# Patient Record
Sex: Male | Born: 1950 | Race: White | Hispanic: No | Marital: Single
Health system: Southern US, Community
[De-identification: ages and names within clinical notes are randomized; demographics above are authoritative.]

## PROBLEM LIST (undated history)

## (undated) DIAGNOSIS — I5033 Acute on chronic diastolic (congestive) heart failure: Secondary | ICD-10-CM

## (undated) DIAGNOSIS — R7881 Bacteremia: Secondary | ICD-10-CM

## (undated) DIAGNOSIS — J9621 Acute and chronic respiratory failure with hypoxia: Secondary | ICD-10-CM

## (undated) DIAGNOSIS — R188 Other ascites: Secondary | ICD-10-CM

## (undated) DIAGNOSIS — I272 Pulmonary hypertension, unspecified: Secondary | ICD-10-CM

---

## 2018-09-13 ENCOUNTER — Inpatient Hospital Stay
Admission: AD | Admit: 2018-09-13 | Discharge: 2018-10-17 | Disposition: A | Discharge status: Hospice | Payer: Medicare Other | Source: Other Acute Inpatient Hospital | Attending: Internal Medicine | Admitting: Internal Medicine

## 2018-09-13 DIAGNOSIS — J90 Pleural effusion, not elsewhere classified: Secondary | ICD-10-CM

## 2018-09-13 DIAGNOSIS — J9690 Respiratory failure, unspecified, unspecified whether with hypoxia or hypercapnia: Secondary | ICD-10-CM

## 2018-09-13 DIAGNOSIS — Z4659 Encounter for fitting and adjustment of other gastrointestinal appliance and device: Secondary | ICD-10-CM

## 2018-09-13 DIAGNOSIS — R188 Other ascites: Secondary | ICD-10-CM | POA: Diagnosis present

## 2018-09-13 DIAGNOSIS — Z9889 Other specified postprocedural states: Secondary | ICD-10-CM

## 2018-09-13 DIAGNOSIS — J189 Pneumonia, unspecified organism: Secondary | ICD-10-CM

## 2018-09-13 DIAGNOSIS — K567 Ileus, unspecified: Secondary | ICD-10-CM

## 2018-09-13 DIAGNOSIS — I5033 Acute on chronic diastolic (congestive) heart failure: Secondary | ICD-10-CM | POA: Diagnosis present

## 2018-09-13 DIAGNOSIS — R7881 Bacteremia: Secondary | ICD-10-CM | POA: Diagnosis present

## 2018-09-13 DIAGNOSIS — Z9289 Personal history of other medical treatment: Secondary | ICD-10-CM

## 2018-09-13 DIAGNOSIS — I272 Pulmonary hypertension, unspecified: Secondary | ICD-10-CM | POA: Diagnosis present

## 2018-09-13 DIAGNOSIS — J9621 Acute and chronic respiratory failure with hypoxia: Secondary | ICD-10-CM | POA: Diagnosis present

## 2018-09-13 DIAGNOSIS — J811 Chronic pulmonary edema: Secondary | ICD-10-CM

## 2018-09-13 DIAGNOSIS — N179 Acute kidney failure, unspecified: Secondary | ICD-10-CM

## 2018-09-13 HISTORY — DX: Acute and chronic respiratory failure with hypoxia: J96.21

## 2018-09-13 HISTORY — DX: Bacteremia: R78.81

## 2018-09-13 HISTORY — DX: Pulmonary hypertension, unspecified: I27.20

## 2018-09-13 HISTORY — DX: Acute on chronic diastolic (congestive) heart failure: I50.33

## 2018-09-13 HISTORY — DX: Other ascites: R18.8

## 2018-09-14 ENCOUNTER — Other Ambulatory Visit (HOSPITAL_COMMUNITY): Payer: Medicare Other

## 2018-09-14 LAB — CBC WITH DIFFERENTIAL/PLATELET
ABS IMMATURE GRANULOCYTES: 0.05 10*3/uL (ref 0.00–0.07)
Basophils Absolute: 0 10*3/uL (ref 0.0–0.1)
Basophils Relative: 0 %
Eosinophils Absolute: 0.1 10*3/uL (ref 0.0–0.5)
Eosinophils Relative: 2 %
HCT: 26.9 % — ABNORMAL LOW (ref 39.0–52.0)
Hemoglobin: 8.2 g/dL — ABNORMAL LOW (ref 13.0–17.0)
Immature Granulocytes: 1 %
LYMPHS PCT: 9 %
Lymphs Abs: 0.6 10*3/uL — ABNORMAL LOW (ref 0.7–4.0)
MCH: 28 pg (ref 26.0–34.0)
MCHC: 30.5 g/dL (ref 30.0–36.0)
MCV: 91.8 fL (ref 80.0–100.0)
MONOS PCT: 9 %
Monocytes Absolute: 0.6 10*3/uL (ref 0.1–1.0)
Neutro Abs: 5.8 10*3/uL (ref 1.7–7.7)
Neutrophils Relative %: 79 %
Platelets: 185 10*3/uL (ref 150–400)
RBC: 2.93 MIL/uL — ABNORMAL LOW (ref 4.22–5.81)
RDW: 14.4 % (ref 11.5–15.5)
WBC: 7.3 10*3/uL (ref 4.0–10.5)
nRBC: 0 % (ref 0.0–0.2)

## 2018-09-14 LAB — PROTIME-INR
INR: 1.27
Prothrombin Time: 15.8 seconds — ABNORMAL HIGH (ref 11.4–15.2)

## 2018-09-14 LAB — CK TOTAL AND CKMB (NOT AT ARMC)
CK, MB: 4.2 ng/mL (ref 0.5–5.0)
Relative Index: INVALID (ref 0.0–2.5)
Total CK: 38 U/L — ABNORMAL LOW (ref 49–397)

## 2018-09-14 LAB — COMPREHENSIVE METABOLIC PANEL
ALT: 10 U/L (ref 0–44)
AST: 23 U/L (ref 15–41)
Albumin: 2.5 g/dL — ABNORMAL LOW (ref 3.5–5.0)
Alkaline Phosphatase: 133 U/L — ABNORMAL HIGH (ref 38–126)
Anion gap: 13 (ref 5–15)
BUN: 65 mg/dL — ABNORMAL HIGH (ref 8–23)
CO2: 37 mmol/L — ABNORMAL HIGH (ref 22–32)
Calcium: 8.4 mg/dL — ABNORMAL LOW (ref 8.9–10.3)
Chloride: 97 mmol/L — ABNORMAL LOW (ref 98–111)
Creatinine, Ser: 2.26 mg/dL — ABNORMAL HIGH (ref 0.61–1.24)
GFR calc Af Amer: 33 mL/min — ABNORMAL LOW (ref >60)
GFR calc non Af Amer: 29 mL/min — ABNORMAL LOW (ref >60)
Glucose, Bld: 128 mg/dL — ABNORMAL HIGH (ref 70–99)
Potassium: 3.9 mmol/L (ref 3.5–5.1)
Sodium: 147 mmol/L — ABNORMAL HIGH (ref 135–145)
Total Bilirubin: 0.9 mg/dL (ref 0.3–1.2)
Total Protein: 6.7 g/dL (ref 6.5–8.1)

## 2018-09-14 LAB — C-REACTIVE PROTEIN: CRP: 7.3 mg/dL — AB (ref <1.0)

## 2018-09-14 LAB — MAGNESIUM: Magnesium: 2 mg/dL (ref 1.7–2.4)

## 2018-09-14 LAB — PHOSPHORUS: PHOSPHORUS: 4.8 mg/dL — AB (ref 2.5–4.6)

## 2018-09-14 LAB — SEDIMENTATION RATE: Sed Rate: 58 mm/hr — ABNORMAL HIGH (ref 0–16)

## 2018-09-14 MED ORDER — CITALOPRAM HYDROBROMIDE 20 MG PO TABS
10.00 | ORAL_TABLET | ORAL | Status: DC
Start: 2018-09-14 — End: 2018-09-14

## 2018-09-14 MED ORDER — SODIUM CHLORIDE 0.9 % IV SOLN
10.00 | INTRAVENOUS | Status: DC
Start: ? — End: 2018-09-14

## 2018-09-14 MED ORDER — GENERIC EXTERNAL MEDICATION
8.00 | Status: DC
Start: 2018-09-14 — End: 2018-09-14

## 2018-09-14 MED ORDER — POLYETHYLENE GLYCOL 3350 17 G PO PACK
17.00 | PACK | ORAL | Status: DC
Start: 2018-09-14 — End: 2018-09-14

## 2018-09-14 MED ORDER — NITROGLYCERIN 0.4 MG SL SUBL
.40 | SUBLINGUAL_TABLET | SUBLINGUAL | Status: DC
Start: ? — End: 2018-09-14

## 2018-09-14 MED ORDER — OXYCODONE HCL 5 MG PO TABS
5.00 | ORAL_TABLET | ORAL | Status: DC
Start: ? — End: 2018-09-14

## 2018-09-14 MED ORDER — GENERIC EXTERNAL MEDICATION
10.00 | Status: DC
Start: ? — End: 2018-09-14

## 2018-09-14 MED ORDER — PANTOPRAZOLE SODIUM 40 MG IV SOLR
40.00 | INTRAVENOUS | Status: DC
Start: 2018-09-13 — End: 2018-09-14

## 2018-09-14 MED ORDER — FAT EMULSION PLANT BASED 20 % IV EMUL
250.00 | INTRAVENOUS | Status: DC
Start: 2018-09-14 — End: 2018-09-14

## 2018-09-14 MED ORDER — GENERIC EXTERNAL MEDICATION
4.00 | Status: DC
Start: ? — End: 2018-09-14

## 2018-09-14 MED ORDER — LACTULOSE 10 GM/15ML PO SOLN
30.00 | ORAL | Status: DC
Start: ? — End: 2018-09-14

## 2018-09-14 MED ORDER — BIOTENE ORALBALANCE DRY MOUTH MT GEL
OROMUCOSAL | Status: DC
Start: ? — End: 2018-09-14

## 2018-09-14 MED ORDER — IPRATROPIUM-ALBUTEROL 0.5-2.5 (3) MG/3ML IN SOLN
3.00 | RESPIRATORY_TRACT | Status: DC
Start: 2018-09-13 — End: 2018-09-14

## 2018-09-14 MED ORDER — FENTANYL CITRATE (PF) 2500 MCG/50ML IJ SOLN
12.50 | INTRAMUSCULAR | Status: DC
Start: ? — End: 2018-09-14

## 2018-09-14 MED ORDER — PROPRANOLOL HCL 10 MG PO TABS
10.00 | ORAL_TABLET | ORAL | Status: DC
Start: 2018-09-13 — End: 2018-09-14

## 2018-09-14 MED ORDER — HYDRALAZINE HCL 20 MG/ML IJ SOLN
20.00 | INTRAMUSCULAR | Status: DC
Start: 2018-09-13 — End: 2018-09-14

## 2018-09-14 MED ORDER — ACETAMINOPHEN 325 MG PO TABS
650.00 | ORAL_TABLET | ORAL | Status: DC
Start: ? — End: 2018-09-14

## 2018-09-14 MED ORDER — ALBUTEROL SULFATE (2.5 MG/3ML) 0.083% IN NEBU
2.50 | INHALATION_SOLUTION | RESPIRATORY_TRACT | Status: DC
Start: ? — End: 2018-09-14

## 2018-09-14 MED ORDER — LABETALOL HCL 5 MG/ML IV SOLN
20.00 | INTRAVENOUS | Status: DC
Start: 2018-09-13 — End: 2018-09-14

## 2018-09-14 MED ORDER — FUROSEMIDE 10 MG/ML IJ SOLN
60.00 | INTRAMUSCULAR | Status: DC
Start: 2018-09-13 — End: 2018-09-14

## 2018-09-15 ENCOUNTER — Other Ambulatory Visit (HOSPITAL_COMMUNITY): Payer: Medicare Other

## 2018-09-15 LAB — CBC
HCT: 24.3 % — ABNORMAL LOW (ref 39.0–52.0)
Hemoglobin: 7.8 g/dL — ABNORMAL LOW (ref 13.0–17.0)
MCH: 29 pg (ref 26.0–34.0)
MCHC: 32.1 g/dL (ref 30.0–36.0)
MCV: 90.3 fL (ref 80.0–100.0)
Platelets: 173 10*3/uL (ref 150–400)
RBC: 2.69 MIL/uL — ABNORMAL LOW (ref 4.22–5.81)
RDW: 14.4 % (ref 11.5–15.5)
WBC: 8 10*3/uL (ref 4.0–10.5)
nRBC: 0 % (ref 0.0–0.2)

## 2018-09-15 LAB — RENAL FUNCTION PANEL
Albumin: 2.4 g/dL — ABNORMAL LOW (ref 3.5–5.0)
Anion gap: 11 (ref 5–15)
BUN: 65 mg/dL — ABNORMAL HIGH (ref 8–23)
CALCIUM: 8.5 mg/dL — AB (ref 8.9–10.3)
CO2: 37 mmol/L — ABNORMAL HIGH (ref 22–32)
Chloride: 95 mmol/L — ABNORMAL LOW (ref 98–111)
Creatinine, Ser: 2.31 mg/dL — ABNORMAL HIGH (ref 0.61–1.24)
GFR calc Af Amer: 32 mL/min — ABNORMAL LOW (ref >60)
GFR calc non Af Amer: 28 mL/min — ABNORMAL LOW (ref >60)
Glucose, Bld: 142 mg/dL — ABNORMAL HIGH (ref 70–99)
Phosphorus: 4.1 mg/dL (ref 2.5–4.6)
Potassium: 3.7 mmol/L (ref 3.5–5.1)
Sodium: 143 mmol/L (ref 135–145)

## 2018-09-15 LAB — CK TOTAL AND CKMB (NOT AT ARMC)
CK, MB: 4.4 ng/mL (ref 0.5–5.0)
Relative Index: INVALID (ref 0.0–2.5)
Total CK: 38 U/L — ABNORMAL LOW (ref 49–397)

## 2018-09-15 LAB — MAGNESIUM: Magnesium: 2 mg/dL (ref 1.7–2.4)

## 2018-09-15 LAB — AMMONIA: Ammonia: 40 umol/L — ABNORMAL HIGH (ref 9–35)

## 2018-09-16 ENCOUNTER — Other Ambulatory Visit (HOSPITAL_COMMUNITY): Payer: Medicare Other

## 2018-09-16 LAB — BLOOD GAS, ARTERIAL
Acid-Base Excess: 11.6 mmol/L — ABNORMAL HIGH (ref 0.0–2.0)
Bicarbonate: 35.5 mmol/L — ABNORMAL HIGH (ref 20.0–28.0)
FIO2: 100
PEEP: 5 cmH2O
PO2 ART: 284 mmHg — AB (ref 83.0–108.0)
Patient temperature: 98.6
RATE: 16 resp/min
pCO2 arterial: 44.2 mmHg (ref 32.0–48.0)
pH, Arterial: 7.515 — ABNORMAL HIGH (ref 7.350–7.450)

## 2018-09-16 LAB — CBC
HCT: 25.6 % — ABNORMAL LOW (ref 39.0–52.0)
Hemoglobin: 8.1 g/dL — ABNORMAL LOW (ref 13.0–17.0)
MCH: 28.8 pg (ref 26.0–34.0)
MCHC: 31.6 g/dL (ref 30.0–36.0)
MCV: 91.1 fL (ref 80.0–100.0)
PLATELETS: 214 10*3/uL (ref 150–400)
RBC: 2.81 MIL/uL — ABNORMAL LOW (ref 4.22–5.81)
RDW: 14.5 % (ref 11.5–15.5)
WBC: 9.1 10*3/uL (ref 4.0–10.5)
nRBC: 0 % (ref 0.0–0.2)

## 2018-09-16 LAB — CK TOTAL AND CKMB (NOT AT ARMC)
CK, MB: 4.1 ng/mL (ref 0.5–5.0)
RELATIVE INDEX: INVALID (ref 0.0–2.5)
Total CK: 40 U/L — ABNORMAL LOW (ref 49–397)

## 2018-09-16 LAB — COMPREHENSIVE METABOLIC PANEL
ALT: 12 U/L (ref 0–44)
ANION GAP: 13 (ref 5–15)
AST: 23 U/L (ref 15–41)
Albumin: 2.3 g/dL — ABNORMAL LOW (ref 3.5–5.0)
Alkaline Phosphatase: 145 U/L — ABNORMAL HIGH (ref 38–126)
BUN: 69 mg/dL — ABNORMAL HIGH (ref 8–23)
CO2: 33 mmol/L — ABNORMAL HIGH (ref 22–32)
Calcium: 8.2 mg/dL — ABNORMAL LOW (ref 8.9–10.3)
Chloride: 95 mmol/L — ABNORMAL LOW (ref 98–111)
Creatinine, Ser: 2.89 mg/dL — ABNORMAL HIGH (ref 0.61–1.24)
GFR calc Af Amer: 25 mL/min — ABNORMAL LOW (ref >60)
GFR calc non Af Amer: 21 mL/min — ABNORMAL LOW (ref >60)
Glucose, Bld: 189 mg/dL — ABNORMAL HIGH (ref 70–99)
Potassium: 4.5 mmol/L (ref 3.5–5.1)
SODIUM: 141 mmol/L (ref 135–145)
Total Bilirubin: 1.2 mg/dL (ref 0.3–1.2)
Total Protein: 6.7 g/dL (ref 6.5–8.1)

## 2018-09-16 LAB — APTT: aPTT: 46 seconds — ABNORMAL HIGH (ref 24–36)

## 2018-09-16 LAB — TROPONIN I
Troponin I: 0.04 ng/mL (ref <0.03)
Troponin I: 0.06 ng/mL (ref <0.03)
Troponin I: 0.08 ng/mL (ref <0.03)
Troponin I: 0.08 ng/mL (ref <0.03)

## 2018-09-16 LAB — PROTIME-INR
INR: 1.33
Prothrombin Time: 16.4 seconds — ABNORMAL HIGH (ref 11.4–15.2)

## 2018-09-16 LAB — PHOSPHORUS: Phosphorus: 5.1 mg/dL — ABNORMAL HIGH (ref 2.5–4.6)

## 2018-09-16 LAB — LACTIC ACID, PLASMA: Lactic Acid, Venous: 2 mmol/L (ref 0.5–1.9)

## 2018-09-16 LAB — MAGNESIUM: Magnesium: 1.9 mg/dL (ref 1.7–2.4)

## 2018-09-16 MED ORDER — LIDOCAINE HCL (PF) 1 % IJ SOLN
INTRAMUSCULAR | Status: AC
  Filled 2018-09-16: qty 30

## 2018-09-16 NOTE — Procedures (Signed)
Ultrasound-guided therapeutic paracentesis performed yielding 4.9 liters of yellow fluid. No immediate complications. EBL none.   

## 2018-09-17 ENCOUNTER — Other Ambulatory Visit (HOSPITAL_COMMUNITY): Payer: Medicare Other

## 2018-09-17 ENCOUNTER — Encounter: Payer: Self-pay | Admitting: Internal Medicine

## 2018-09-17 DIAGNOSIS — R188 Other ascites: Secondary | ICD-10-CM

## 2018-09-17 DIAGNOSIS — J9621 Acute and chronic respiratory failure with hypoxia: Secondary | ICD-10-CM | POA: Diagnosis not present

## 2018-09-17 DIAGNOSIS — R7881 Bacteremia: Secondary | ICD-10-CM | POA: Diagnosis present

## 2018-09-17 DIAGNOSIS — I5033 Acute on chronic diastolic (congestive) heart failure: Secondary | ICD-10-CM | POA: Diagnosis not present

## 2018-09-17 DIAGNOSIS — I272 Pulmonary hypertension, unspecified: Secondary | ICD-10-CM | POA: Diagnosis present

## 2018-09-17 LAB — RENAL FUNCTION PANEL
Albumin: 2.3 g/dL — ABNORMAL LOW (ref 3.5–5.0)
Anion gap: 15 (ref 5–15)
BUN: 74 mg/dL — ABNORMAL HIGH (ref 8–23)
CO2: 30 mmol/L (ref 22–32)
Calcium: 8 mg/dL — ABNORMAL LOW (ref 8.9–10.3)
Chloride: 98 mmol/L (ref 98–111)
Creatinine, Ser: 2.52 mg/dL — ABNORMAL HIGH (ref 0.61–1.24)
GFR calc Af Amer: 29 mL/min — ABNORMAL LOW (ref >60)
GFR calc non Af Amer: 25 mL/min — ABNORMAL LOW (ref >60)
Glucose, Bld: 122 mg/dL — ABNORMAL HIGH (ref 70–99)
Phosphorus: 4.3 mg/dL (ref 2.5–4.6)
Potassium: 3.8 mmol/L (ref 3.5–5.1)
Sodium: 143 mmol/L (ref 135–145)

## 2018-09-17 LAB — CBC
HEMATOCRIT: 25.8 % — AB (ref 39.0–52.0)
Hemoglobin: 8.2 g/dL — ABNORMAL LOW (ref 13.0–17.0)
MCH: 28 pg (ref 26.0–34.0)
MCHC: 31.8 g/dL (ref 30.0–36.0)
MCV: 88.1 fL (ref 80.0–100.0)
Platelets: 164 10*3/uL (ref 150–400)
RBC: 2.93 MIL/uL — ABNORMAL LOW (ref 4.22–5.81)
RDW: 14.6 % (ref 11.5–15.5)
WBC: 8.9 10*3/uL (ref 4.0–10.5)
nRBC: 0 % (ref 0.0–0.2)

## 2018-09-17 LAB — MAGNESIUM: MAGNESIUM: 1.9 mg/dL (ref 1.7–2.4)

## 2018-09-17 LAB — PROCALCITONIN: PROCALCITONIN: 0.26 ng/mL

## 2018-09-17 LAB — LACTIC ACID, PLASMA: Lactic Acid, Venous: 0.7 mmol/L (ref 0.5–1.9)

## 2018-09-17 MED ORDER — LIDOCAINE HCL (PF) 1 % IJ SOLN
INTRAMUSCULAR | Status: AC
  Filled 2018-09-17: qty 30

## 2018-09-17 MED FILL — Medication: Qty: 1 | Status: AC

## 2018-09-17 NOTE — Progress Notes (Signed)
PROCEDURE SUMMARY:  Successful US guided left thoracentesis. Yielded 750 mL of yellow fluid. Pt tolerated procedure well. No immediate complications.  Specimen was not sent for labs. CXR ordered.  EBL < 5 mL  Hoyt Koch PA-C 09/17/2018 4:07 PM

## 2018-09-17 NOTE — Consult Note (Addendum)
Pulmonary Critical Care Medicine [facility] [place]  PULMONARY SERVICE  Date of Service: 09/17/2018  PULMONARY CRITICAL CARE Frank Levy  ZOX:[id number]  DOB: Dec 21, 1950   DOA: 09/13/2018  Referring Physician: Carron Curie, MD  HPI: Frank Levy is a 68 y.o. male seen for follow up of Acute on Chronic Respiratory Failure.  Patient was transferred to our facility for further management apparently had some issues with decompensation and ended up having to be intubated now is on the ventilator on mechanical ventilation orally intubated.  At the other facility had a diagnosis of acute heart failure MRSA bacteremia with cellulitis respiratory failure pulmonary hypertension ascites nonalcoholic hepatitis with cirrhosis.  Patient presented with increasing shortness of breath saturations were noted to be in the 70s.  In the emergency department he was found to have an acute exacerbation of his heart failure along with cirrhosis and ascites.  Chest x-ray was showing pneumonia at the time so the patient was admitted for treatment.  His hospital course was as follows.  He had an echocardiogram done which showed ejection fraction of 60 to 65% with moderate diastolic dysfunction and a small pericardial effusion.  Patient did not seem to be showing any kind of improvement he had a work-up done for endocarditis which was negative patient was supposed to be transferred however suffered cardiac arrest and was intubated at that time.  Patient was also noted to have worsening of his renal function likely secondary to tubular necrosis which did show improvement.  Subsequently patient was extubated on January 21 and still was having intermittent confusion at times and with poor intake.  Patient also had some skin lesions which were evaluated and managed.  He was on antibiotics included vancomycin and Zosyn Merrem and daptomycin.  Review of Systems:  ROS performed and is unremarkable other than  noted above.  PMH: Past Medical History:  Diagnosis Date  . Anemia  . Ascites 05/28/2018  . Chronic diastolic heart failure (*) 07/05/2018  . Essential hypertension 04/04/2018  . Hypertension  . Liver cirrhosis (*) 03/03/2018  . Osteomyelitis (*) 04/13/2018  . Pulmonary hypertension, moderate to severe (*) 05/28/2018  . Renal disorder   PSH: Past Surgical History:  Procedure Laterality Date  . Foot surgery  Heal surgery    Allergies: No known drug allergies  Social history: No history of alcohol abuse Never smoker  Family History: Non-Contributory to the present illness    Medications: Reviewed on Rounds  Physical Exam:  Vitals: Temperature 97.3 pulse 64 respiratory rate 18 blood pressure 122/82 saturations 98%  Ventilator Settings mode ventilation assist control FiO2 28% tidal volume 480 PEEP 5 sedated on the ventilator  . General: Comfortable at this time . Eyes: Grossly normal lids, irises & conjunctiva . ENT: grossly tongue is normal . Neck: no obvious mass . Cardiovascular: S1-S2 normal no gallop or rub . Respiratory: Scattered distant rhonchi noted bilaterally . Abdomen: Distended but soft . Skin: no rash seen on limited exam . Musculoskeletal: not rigid . Psychiatric:unable to assess . Neurologic: no seizure no involuntary movements         Labs on Admission:  Basic Metabolic Panel: Recent Labs  Lab 09/14/18 0649 09/15/18 0706 09/16/18 0107 09/17/18 0544  NA 147* 143 141 143  K 3.9 3.7 4.5 3.8  CL 97* 95* 95* 98  CO2 37* 37* 33* 30  GLUCOSE 128* 142* 189* 122*  BUN 65* 65* 69* 74*  CREATININE 2.26* 2.31* 2.89* 2.52*  CALCIUM 8.4* 8.5* 8.2* 8.0*  MG 2.0 2.0 1.9 1.9  PHOS 4.8* 4.1 5.1* 4.3    Recent Labs  Lab 09/16/18 0207  PHART 7.515*  PCO2ART 44.2  PO2ART 284*  HCO3 35.5*  O2SAT RESULTS ABOVE REPORTABLE RANGE    Liver Function Tests: Recent Labs  Lab 09/14/18 0649 09/15/18 0706 09/16/18 0107 09/17/18 0544  AST 23  --  23   --   ALT 10  --  12  --   ALKPHOS 133*  --  145*  --   BILITOT 0.9  --  1.2  --   PROT 6.7  --  6.7  --   ALBUMIN 2.5* 2.4* 2.3* 2.3*   No results for input(s): LIPASE, AMYLASE in the last 168 hours. Recent Labs  Lab 09/15/18 0706  AMMONIA 40*    CBC: Recent Labs  Lab 09/14/18 0649 09/15/18 0706 09/16/18 0107 09/17/18 0544  WBC 7.3 8.0 9.1 8.9  NEUTROABS 5.8  --   --   --   HGB 8.2* 7.8* 8.1* 8.2*  HCT 26.9* 24.3* 25.6* 25.8*  MCV 91.8 90.3 91.1 88.1  PLT 185 173 214 164    Cardiac Enzymes: Recent Labs  Lab 09/14/18 0649 09/15/18 0706 09/16/18 0107 09/16/18 0643 09/16/18 1227 09/16/18 1844  CKTOTAL 38* 38* 40*  --   --   --   CKMB 4.2 4.4 4.1  --   --   --   TROPONINI  --   --  0.04* 0.06* 0.08* 0.08*    BNP (last 3 results) No results for input(s): BNP in the last 8760 hours.  ProBNP (last 3 results) No results for input(s): PROBNP in the last 8760 hours.   Radiological Exams on Admission: US Abdomen Complete  Result Date: 09/15/2018 CLINICAL DATA:  Ascites. EXAM: ABDOMEN ULTRASOUND COMPLETE COMPARISON:  None. FINDINGS: Gallbladder: No gallstones are seen. Wall thickness mildly increased, 3 mm. Negative sonographic Murphy's sign. Common bile duct: Diameter: 4 mm. Liver: No focal lesion identified. Nodular contour. Increased echogenicity. Portal vein is patent on color Doppler imaging with normal direction of blood flow towards the liver. IVC: No abnormality visualized. Pancreas: Visualized portion unremarkable. Spleen: Size and appearance within normal limits. Right Kidney: Length: 9.6 cm. Echogenicity within normal limits. No mass or hydronephrosis visualized. Left Kidney: Length: 10.3 cm. Echogenicity within normal limits. No mass or hydronephrosis visualized. Abdominal aorta: No aneurysm visualized. Other findings: Extensive ascites.  BILATERAL pleural effusions. IMPRESSION: Extensive ascites. BILATERAL pleural effusions. Hepatic cirrhosis. No gallstones or  biliary ductal dilatation. Electronically Signed   By: Elsie Stain M.D.   On: 09/15/2018 07:01   US Paracentesis  Result Date: 09/16/2018 INDICATION: Patient with history of CHF, cirrhosis, ascites, bilateral pleural effusions. Request made for therapeutic paracentesis. EXAM: ULTRASOUND GUIDED THERAPEUTIC PARACENTESIS MEDICATIONS: None COMPLICATIONS: None immediate. PROCEDURE: Informed written consent was obtained from the patient after a discussion of the risks, benefits and alternatives to treatment. A timeout was performed prior to the initiation of the procedure. Initial ultrasound scanning demonstrates a large amount of ascites within the right lower abdominal quadrant. The right lower abdomen was prepped and draped in the usual sterile fashion. 1% lidocaine was used for local anesthesia. Following this, a 19 gauge, 7-cm, Yueh catheter was introduced. An ultrasound image was saved for documentation purposes. The paracentesis was performed. The catheter was removed and a dressing was applied. The patient tolerated the procedure well without immediate post procedural complication. FINDINGS: A total of approximately 4.9 liters of yellow  fluid was removed. IMPRESSION: Successful ultrasound-guided therapeutic paracentesis yielding 4.9 liters of peritoneal fluid. Read by: Jeananne RamaKevin Allred, PA-C Electronically Signed   By: Frank ClickArthur  Hoss M.D.   On: 09/16/2018 11:39   Dg Chest Port 1 View  Result Date: 09/17/2018 CLINICAL DATA:  TRACH,HX PLEURAL EFFUSION EXAM: PORTABLE CHEST - 1 VIEW COMPARISON:  the previous day's study FINDINGS: Endotracheal tube, gastric tube, right arm PICC stable in position. Mild bibasilar interstitial infiltrates or edema, improved since previous. Small left pleural effusion. Heart size and mediastinal contours are within normal limits. Aortic Atherosclerosis (ICD10-170.0). No pneumothorax. Visualized bones unremarkable. IMPRESSION: 1. Improving bibasilar infiltrates or edema. 2. Support  hardware stable in position. Electronically Signed   By: Corlis Leak  Hassell M.D.   On: 09/17/2018 08:11   Dg Chest Port 1 View  Result Date: 09/16/2018 CLINICAL DATA:  Endotracheal tube placement. EXAM: PORTABLE CHEST 1 VIEW COMPARISON:  Chest radiograph September 14, 2018 FINDINGS: Endotracheal tube tip projects 3.2 cm above the carina. RIGHT PICC distal tip projects in distal superior vena cava. Stable cardiomegaly. Calcified aortic arch. Pulmonary vascular congestion interstitial prominence. Moderate layering pleural effusions with underlying similar consolidation. No pneumothorax. Soft tissue planes and included osseous structures are unchanged. IMPRESSION: 1. Endotracheal tube tip projects 3.2 cm above the carina. Stable RIGHT PICC. 2. Stable cardiomegaly and interstitial edema. 3. Increased moderate pleural effusions with underlying consolidation. 4.  Aortic Atherosclerosis (ICD10-I70.0). Electronically Signed   By: Awilda Metroourtnay  Bloomer M.D.   On: 09/16/2018 01:38   Dg Chest Port 1 View  Result Date: 09/14/2018 CLINICAL DATA:  Respiratory failure.  Aspiration pneumonia. EXAM: PORTABLE CHEST 1 VIEW COMPARISON:  None. FINDINGS: PICC tip at the cavoatrial junction 3.7 cm below the carina. There are small bilateral pleural effusions with hazy bilateral pulmonary infiltrates. Pulmonary vascularity is at the upper limits of normal. Aortic atherosclerosis. Bones are normal. IMPRESSION: 1. Small bilateral pleural effusions with bibasilar infiltrates. 2.  Aortic Atherosclerosis (ICD10-I70.0). Electronically Signed   By: Francene BoyersJames  Maxwell M.D.   On: 09/14/2018 08:34   Dg Abd Portable 1v  Result Date: 09/16/2018 CLINICAL DATA:  NG tube placement EXAM: PORTABLE ABDOMEN - 1 VIEW COMPARISON:  None. FINDINGS: Enteric tube likely terminates in the duodenal bulb. Mildly prominent loops of small bowel in the central abdomen. Visualized osseous structures are within normal limits. IMPRESSION: Enteric tube likely terminates in the  duodenal bulb. Electronically Signed   By: Charline BillsSriyesh  Krishnan M.D.   On: 09/16/2018 10:30    Assessment/Plan Active Problems:   Acute on chronic respiratory failure with hypoxia (HCC)   Acute on chronic diastolic heart failure (HCC)   Pulmonary hypertension (HCC)   Ascites of liver   MRSA bacteremia   1. Acute on chronic respiratory failure with hypoxia at this time patient is on full support also has been sedated with propofol.  Patient was able to do pressure support for a short time however had increased work of breathing noted so therefore it was terminated.  We will continue to reassess.  Needs to be probably wean down on sedation however the family specifically the fianc did note that he gets very combative when they try to wean his sedation down. 2. Acute on chronic diastolic heart failure right now appears to be compensated continue to monitor radiologically continue to monitor fluid intake. 3. Pulmonary hypertension severe disease prognosis is guarded we will continue with supportive care oxygen therapy 4. Ascites might need to be reassessed and see if he needs another paracentesis. 5. MRSA  bacteremia treated we will continue to monitor cultures patient has been ruled out for endocarditis based on the trends thoracic echocardiogram reports  I have personally seen and evaluated the patient, evaluated laboratory and imaging results, formulated the assessment and plan and placed orders.  Patient is critically ill in danger of cardiac arrest and death case was discussed with the fianc at bedside and also discussed with the respiratory therapist and clinical team during rounds The Patient requires high complexity decision making for assessment and support.  Case was discussed on Rounds with the Respiratory Therapy Staff Time Spent 70minutes  Yevonne PaxSaadat A Khan, MD [facility] CenterFCCP Pulmonary Critical Care Medicine Sleep Medicine

## 2018-09-18 DIAGNOSIS — R188 Other ascites: Secondary | ICD-10-CM | POA: Diagnosis not present

## 2018-09-18 DIAGNOSIS — I5033 Acute on chronic diastolic (congestive) heart failure: Secondary | ICD-10-CM | POA: Diagnosis not present

## 2018-09-18 DIAGNOSIS — Z9889 Other specified postprocedural states: Secondary | ICD-10-CM

## 2018-09-18 DIAGNOSIS — R7881 Bacteremia: Secondary | ICD-10-CM | POA: Diagnosis not present

## 2018-09-18 DIAGNOSIS — J9621 Acute and chronic respiratory failure with hypoxia: Secondary | ICD-10-CM | POA: Diagnosis not present

## 2018-09-18 LAB — AMIODARONE LEVEL
AMIODARONE LVL: NOT DETECTED ug/mL (ref 1.0–2.5)
N-Desethyl-Amiodarone: NOT DETECTED ug/mL (ref 1.0–2.5)

## 2018-09-18 NOTE — Progress Notes (Addendum)
Pulmonary Critical Care Medicine [facility] [place]   PULMONARY CRITICAL CARE SERVICE  PROGRESS NOTE  Date of Service: 09/18/2018  Frank BROUILLET  WNU:[id number]  DOB: [birth year]   DOA: 09/13/2018  Referring Physician: Carron Curie, MD  HPI: Frank Levy is a 68 y.o. male seen for follow up of Acute on Chronic Respiratory Failure.  Patient underwent thoracentesis yesterday with removal of 750 cc of fluid.  Medications: Reviewed on Rounds  Physical Exam:  Vitals: Temperature 98.6 pulse 85 respiratory 20 blood pressure 144/65 saturations 100%  Ventilator Settings mode ventilation pressure support FiO2 28% tidal volume 350 pressure poor 12 PEEP 5  . General: Comfortable at this time . Eyes: Grossly normal lids, irises & conjunctiva . ENT: grossly tongue is normal . Neck: no obvious mass . Cardiovascular: S1 S2 normal no gallop . Respiratory: No rhonchi or rales are noted at this time . Abdomen: soft . Skin: no rash seen on limited exam . Musculoskeletal: not rigid . Psychiatric:unable to assess . Neurologic: no seizure no involuntary movements         Lab Data:   Basic Metabolic Panel: Recent Labs  Lab 09/14/18 0649 09/15/18 0706 09/16/18 0107 09/17/18 0544  NA 147* 143 141 143  K 3.9 3.7 4.5 3.8  CL 97* 95* 95* 98  CO2 37* 37* 33* 30  GLUCOSE 128* 142* 189* 122*  BUN 65* 65* 69* 74*  CREATININE 2.26* 2.31* 2.89* 2.52*  CALCIUM 8.4* 8.5* 8.2* 8.0*  MG 2.0 2.0 1.9 1.9  PHOS 4.8* 4.1 5.1* 4.3    ABG: Recent Labs  Lab 09/16/18 0207  PHART 7.515*  PCO2ART 44.2  PO2ART 284*  HCO3 35.5*  O2SAT RESULTS ABOVE REPORTABLE RANGE    Liver Function Tests: Recent Labs  Lab 09/14/18 0649 09/15/18 0706 09/16/18 0107 09/17/18 0544  AST 23  --  23  --   ALT 10  --  12  --   ALKPHOS 133*  --  145*  --   BILITOT 0.9  --  1.2  --   PROT 6.7  --  6.7  --   ALBUMIN 2.5* 2.4* 2.3* 2.3*   No results for input(s): LIPASE, AMYLASE in the last 168  hours. Recent Labs  Lab 09/15/18 0706  AMMONIA 40*    CBC: Recent Labs  Lab 09/14/18 0649 09/15/18 0706 09/16/18 0107 09/17/18 0544  WBC 7.3 8.0 9.1 8.9  NEUTROABS 5.8  --   --   --   HGB 8.2* 7.8* 8.1* 8.2*  HCT 26.9* 24.3* 25.6* 25.8*  MCV 91.8 90.3 91.1 88.1  PLT 185 173 214 164    Cardiac Enzymes: Recent Labs  Lab 09/14/18 0649 09/15/18 0706 09/16/18 0107 09/16/18 0643 09/16/18 1227 09/16/18 1844  CKTOTAL 38* 38* 40*  --   --   --   CKMB 4.2 4.4 4.1  --   --   --   TROPONINI  --   --  0.04* 0.06* 0.08* 0.08*    BNP (last 3 results) No results for input(s): BNP in the last 8760 hours.  ProBNP (last 3 results) No results for input(s): PROBNP in the last 8760 hours.  Radiological Exams: US Paracentesis  Result Date: 09/16/2018 INDICATION: Patient with history of CHF, cirrhosis, ascites, bilateral pleural effusions. Request made for therapeutic paracentesis. EXAM: ULTRASOUND GUIDED THERAPEUTIC PARACENTESIS MEDICATIONS: None COMPLICATIONS: None immediate. PROCEDURE: Informed written consent was obtained from the patient after a discussion of the risks, benefits and alternatives to treatment. A  timeout was performed prior to the initiation of the procedure. Initial ultrasound scanning demonstrates a large amount of ascites within the right lower abdominal quadrant. The right lower abdomen was prepped and draped in the usual sterile fashion. 1% lidocaine was used for local anesthesia. Following this, a 19 gauge, 7-cm, Yueh catheter was introduced. An ultrasound image was saved for documentation purposes. The paracentesis was performed. The catheter was removed and a dressing was applied. The patient tolerated the procedure well without immediate post procedural complication. FINDINGS: A total of approximately 4.9 liters of yellow fluid was removed. IMPRESSION: Successful ultrasound-guided therapeutic paracentesis yielding 4.9 liters of peritoneal fluid. Read by: Jeananne Rama,  PA-C Electronically Signed   By: Jolaine Click M.D.   On: 09/16/2018 11:39   Dg Chest Port 1 View  Result Date: 09/17/2018 CLINICAL DATA:  Post left-sided thoracentesis EXAM: PORTABLE CHEST 1 VIEW COMPARISON:  Earlier same day; 09/16/2018; 09/14/2018 FINDINGS: Grossly unchanged cardiac silhouette and mediastinal contours. Atherosclerotic plaque within thoracic aorta. Stable position of support apparatus. Slight reduction in persistent trace left-sided effusion post thoracentesis. No pneumothorax. Right-sided pleural effusion is unchanged. Improved aeration of the left lung with persistent bibasilar opacities, right greater than left, atelectasis versus infiltrate. No new focal airspace opacities. Pulmonary venous congestion without frank evidence of edema. No acute osseous abnormalities. IMPRESSION: 1. Interval reduction in persistent trace right-sided effusion post thoracentesis. No pneumothorax. 2. Stable positioning of support apparatus. 3. Unchanged small right-sided effusion. 4. Improved aeration of left lung base with persistent bibasilar opacities, right greater than left, atelectasis versus infiltrate. Electronically Signed   By: Simonne Come M.D.   On: 09/17/2018 16:27   Dg Chest Port 1 View  Result Date: 09/17/2018 CLINICAL DATA:  TRACH,HX PLEURAL EFFUSION EXAM: PORTABLE CHEST - 1 VIEW COMPARISON:  the previous day's study FINDINGS: Endotracheal tube, gastric tube, right arm PICC stable in position. Mild bibasilar interstitial infiltrates or edema, improved since previous. Small left pleural effusion. Heart size and mediastinal contours are within normal limits. Aortic Atherosclerosis (ICD10-170.0). No pneumothorax. Visualized bones unremarkable. IMPRESSION: 1. Improving bibasilar infiltrates or edema. 2. Support hardware stable in position. Electronically Signed   By: Corlis Leak M.D.   On: 09/17/2018 08:11   US Thoracentesis Asp Pleural Space W/img Guide  Result Date: 09/17/2018 INDICATION: Patient  with history of acute respiratory failure, pleural effusion. Request is made for therapeutic left thoracentesis. EXAM: ULTRASOUND GUIDED THERAPEUTIC LEFT THORACENTESIS MEDICATIONS: 10 mL 1% lidocaine COMPLICATIONS: None immediate. PROCEDURE: An ultrasound guided thoracentesis was thoroughly discussed with the patient and questions answered. The benefits, risks, alternatives and complications were also discussed. The patient understands and wishes to proceed with the procedure. Written consent was obtained. Ultrasound was performed to localize and mark an adequate pocket of fluid in the left chest. The area was then prepped and draped in the normal sterile fashion. 1% Lidocaine was used for local anesthesia. Under ultrasound guidance a Safe-T-Centesis catheter was introduced. Thoracentesis was performed. The catheter was removed and a dressing applied. FINDINGS: A total of approximately 750 mL of yellow fluid was removed. IMPRESSION: Successful ultrasound guided therapeutic left thoracentesis yielding 750 mL of pleural fluid. Read by: Loyce Dys PA-C Electronically Signed   By: Simonne Come M.D.   On: 09/17/2018 16:28    Assessment/Plan Active Problems:   Acute on chronic respiratory failure with hypoxia (HCC)   Acute on chronic diastolic heart failure (HCC)   Pulmonary hypertension (HCC)   Ascites of liver   MRSA bacteremia  1. Acute on chronic respiratory failure with hypoxia we will continue with pressure support wean continue secretion management pulmonary toilet 2. Acute on chronic diastolic heart failure at baseline we will continue present therapy 3. Pulmonary hypertension oxygen therapy 4. Ascites we will continue with supportive care likely fluid will reaccumulate in the chest we will continue to monitor 5. MRSA bacteremia treated we will continue present management   I have personally seen and evaluated the patient, evaluated laboratory and imaging results, formulated the assessment  and plan and placed orders.  Time spent 35 minutes reviewing the chart discussion with respiratory therapy as well as treatment team.  Patient has high risk airway The Patient requires high complexity decision making for assessment and support.  Case was discussed on Rounds with the Respiratory Therapy Staff  Yevonne PaxSaadat A , MD [facility] Center[facility] Pulmonary Critical Care Medicine Sleep Medicine

## 2018-09-19 DIAGNOSIS — I5033 Acute on chronic diastolic (congestive) heart failure: Secondary | ICD-10-CM | POA: Diagnosis not present

## 2018-09-19 DIAGNOSIS — R7881 Bacteremia: Secondary | ICD-10-CM | POA: Diagnosis not present

## 2018-09-19 DIAGNOSIS — J9621 Acute and chronic respiratory failure with hypoxia: Secondary | ICD-10-CM | POA: Diagnosis not present

## 2018-09-19 DIAGNOSIS — R188 Other ascites: Secondary | ICD-10-CM | POA: Diagnosis not present

## 2018-09-19 NOTE — Progress Notes (Signed)
Pulmonary Critical Care Medicine [facility] [place]   PULMONARY CRITICAL CARE SERVICE  PROGRESS NOTE  Date of Service: 09/19/2018  Frank Levy  HWE:[id number]  DOB: [birth year]   DOA: 09/13/2018  Referring Physician: Carron Curie, MD  HPI: Frank Levy is a 68 y.o. male seen for follow up of Acute on Chronic Respiratory Failure.  Patient currently is on pressure support mode is comfortable without distress right now is on 28% FiO2 with PEEP of 5  Medications: Reviewed on Rounds  Physical Exam:  Vitals: Temperature 98.5 pulse 54 respiratory 16 blood pressure 139/70 saturations 99%  Ventilator Settings on the ventilator and pressure support mode FiO2 28% tidal volume 450 PEEP 5  . General: Comfortable at this time . Eyes: Grossly normal lids, irises & conjunctiva . ENT: grossly tongue is normal . Neck: no obvious mass . Cardiovascular: S1 S2 normal no gallop . Respiratory: Scattered rhonchi noted bilaterally . Abdomen: soft . Skin: no rash seen on limited exam . Musculoskeletal: not rigid . Psychiatric:unable to assess . Neurologic: no seizure no involuntary movements         Lab Data:   Basic Metabolic Panel: Recent Labs  Lab 09/14/18 0649 09/15/18 0706 09/16/18 0107 09/17/18 0544  NA 147* 143 141 143  K 3.9 3.7 4.5 3.8  CL 97* 95* 95* 98  CO2 37* 37* 33* 30  GLUCOSE 128* 142* 189* 122*  BUN 65* 65* 69* 74*  CREATININE 2.26* 2.31* 2.89* 2.52*  CALCIUM 8.4* 8.5* 8.2* 8.0*  MG 2.0 2.0 1.9 1.9  PHOS 4.8* 4.1 5.1* 4.3    ABG: Recent Labs  Lab 09/16/18 0207  PHART 7.515*  PCO2ART 44.2  PO2ART 284*  HCO3 35.5*  O2SAT RESULTS ABOVE REPORTABLE RANGE    Liver Function Tests: Recent Labs  Lab 09/14/18 0649 09/15/18 0706 09/16/18 0107 09/17/18 0544  AST 23  --  23  --   ALT 10  --  12  --   ALKPHOS 133*  --  145*  --   BILITOT 0.9  --  1.2  --   PROT 6.7  --  6.7  --   ALBUMIN 2.5* 2.4* 2.3* 2.3*   No results for input(s): LIPASE,  AMYLASE in the last 168 hours. Recent Labs  Lab 09/15/18 0706  AMMONIA 40*    CBC: Recent Labs  Lab 09/14/18 0649 09/15/18 0706 09/16/18 0107 09/17/18 0544  WBC 7.3 8.0 9.1 8.9  NEUTROABS 5.8  --   --   --   HGB 8.2* 7.8* 8.1* 8.2*  HCT 26.9* 24.3* 25.6* 25.8*  MCV 91.8 90.3 91.1 88.1  PLT 185 173 214 164    Cardiac Enzymes: Recent Labs  Lab 09/14/18 0649 09/15/18 0706 09/16/18 0107 09/16/18 0643 09/16/18 1227 09/16/18 1844  CKTOTAL 38* 38* 40*  --   --   --   CKMB 4.2 4.4 4.1  --   --   --   TROPONINI  --   --  0.04* 0.06* 0.08* 0.08*    BNP (last 3 results) No results for input(s): BNP in the last 8760 hours.  ProBNP (last 3 results) No results for input(s): PROBNP in the last 8760 hours.  Radiological Exams: Dg Chest Port 1 View  Result Date: 09/17/2018 CLINICAL DATA:  Post left-sided thoracentesis EXAM: PORTABLE CHEST 1 VIEW COMPARISON:  Earlier same day; 09/16/2018; 09/14/2018 FINDINGS: Grossly unchanged cardiac silhouette and mediastinal contours. Atherosclerotic plaque within thoracic aorta. Stable position of support apparatus. Slight reduction in  persistent trace left-sided effusion post thoracentesis. No pneumothorax. Right-sided pleural effusion is unchanged. Improved aeration of the left lung with persistent bibasilar opacities, right greater than left, atelectasis versus infiltrate. No new focal airspace opacities. Pulmonary venous congestion without frank evidence of edema. No acute osseous abnormalities. IMPRESSION: 1. Interval reduction in persistent trace right-sided effusion post thoracentesis. No pneumothorax. 2. Stable positioning of support apparatus. 3. Unchanged small right-sided effusion. 4. Improved aeration of left lung base with persistent bibasilar opacities, right greater than left, atelectasis versus infiltrate. Electronically Signed   By: Simonne Come M.D.   On: 09/17/2018 16:27   US Thoracentesis Asp Pleural Space W/img Guide  Result  Date: 09/17/2018 INDICATION: Patient with history of acute respiratory failure, pleural effusion. Request is made for therapeutic left thoracentesis. EXAM: ULTRASOUND GUIDED THERAPEUTIC LEFT THORACENTESIS MEDICATIONS: 10 mL 1% lidocaine COMPLICATIONS: None immediate. PROCEDURE: An ultrasound guided thoracentesis was thoroughly discussed with the patient and questions answered. The benefits, risks, alternatives and complications were also discussed. The patient understands and wishes to proceed with the procedure. Written consent was obtained. Ultrasound was performed to localize and mark an adequate pocket of fluid in the left chest. The area was then prepped and draped in the normal sterile fashion. 1% Lidocaine was used for local anesthesia. Under ultrasound guidance a Safe-T-Centesis catheter was introduced. Thoracentesis was performed. The catheter was removed and a dressing applied. FINDINGS: A total of approximately 750 mL of yellow fluid was removed. IMPRESSION: Successful ultrasound guided therapeutic left thoracentesis yielding 750 mL of pleural fluid. Read by: Loyce Dys PA-C Electronically Signed   By: Simonne Come M.D.   On: 09/17/2018 16:28    Assessment/Plan Active Problems:   Acute on chronic respiratory failure with hypoxia (HCC)   Acute on chronic diastolic heart failure (HCC)   Pulmonary hypertension (HCC)   Ascites of liver   MRSA bacteremia   1. Acute on chronic respiratory failure with hypoxia weaning on pressure support right now goal is for up to 8 hours today 2. Acute on chronic diastolic heart failure continue with supportive care monitor fluid status 3. Pulmonary hypertension continue with oxygen therapy 4. Ascites unchanged continue to monitor 5. MRSA bacteremia treated clinically improving   I have personally seen and evaluated the patient, evaluated laboratory and imaging results, formulated the assessment and plan and placed orders. The Patient requires high  complexity decision making for assessment and support.  Case was discussed on Rounds with the Respiratory Therapy Staff  Yevonne Pax, MD [facility] Pulmonary Critical Care Medicine Sleep Medicine

## 2018-09-20 DIAGNOSIS — R188 Other ascites: Secondary | ICD-10-CM | POA: Diagnosis not present

## 2018-09-20 DIAGNOSIS — I5033 Acute on chronic diastolic (congestive) heart failure: Secondary | ICD-10-CM | POA: Diagnosis not present

## 2018-09-20 DIAGNOSIS — J9621 Acute and chronic respiratory failure with hypoxia: Secondary | ICD-10-CM | POA: Diagnosis not present

## 2018-09-20 DIAGNOSIS — R7881 Bacteremia: Secondary | ICD-10-CM | POA: Diagnosis not present

## 2018-09-20 LAB — CK TOTAL AND CKMB (NOT AT ARMC)
CK, MB: 4.1 ng/mL (ref 0.5–5.0)
Relative Index: INVALID (ref 0.0–2.5)
Total CK: 58 U/L (ref 49–397)

## 2018-09-20 NOTE — Progress Notes (Signed)
Pulmonary Critical Care Medicine [facility] [place]   PULMONARY CRITICAL CARE SERVICE  PROGRESS NOTE  Date of Service: 09/20/2018  Frank Levy  CVE:[id number]  DOB: 1951/07/06   DOA: 09/13/2018  Referring Physician: Carron Curie, MD  HPI: Frank Levy is a 68 y.o. male seen for follow up of Acute on Chronic Respiratory Failure.  Patient's fianc and sister at the bedside they were updated.  Patient is weaning today on pressure support mode his chest x-ray was reviewed with the family.  In addition he is on pressure support is goal about 12 hours.  Medications: Reviewed on Rounds  Physical Exam:  Vitals: Temperature 97.4 pulse 54 respiratory 18 blood pressure 103/61 saturations 98%  Ventilator Settings mode ventilation pressure support FiO2 28% tidal volume 350 pressure support 12 PEEP 5  . General: Comfortable at this time . Eyes: Grossly normal lids, irises & conjunctiva . ENT: grossly tongue is normal . Neck: no obvious mass . Cardiovascular: S1 S2 normal no gallop . Respiratory: Scattered rhonchi noted bilaterally . Abdomen: soft . Skin: no rash seen on limited exam . Musculoskeletal: not rigid . Psychiatric:unable to assess . Neurologic: no seizure no involuntary movements         Lab Data:   Basic Metabolic Panel: Recent Labs  Lab 09/14/18 0649 09/15/18 0706 09/16/18 0107 09/17/18 0544  NA 147* 143 141 143  K 3.9 3.7 4.5 3.8  CL 97* 95* 95* 98  CO2 37* 37* 33* 30  GLUCOSE 128* 142* 189* 122*  BUN 65* 65* 69* 74*  CREATININE 2.26* 2.31* 2.89* 2.52*  CALCIUM 8.4* 8.5* 8.2* 8.0*  MG 2.0 2.0 1.9 1.9  PHOS 4.8* 4.1 5.1* 4.3    ABG: Recent Labs  Lab 09/16/18 0207  PHART 7.515*  PCO2ART 44.2  PO2ART 284*  HCO3 35.5*  O2SAT RESULTS ABOVE REPORTABLE RANGE    Liver Function Tests: Recent Labs  Lab 09/14/18 0649 09/15/18 0706 09/16/18 0107 09/17/18 0544  AST 23  --  23  --   ALT 10  --  12  --   ALKPHOS 133*  --  145*  --    BILITOT 0.9  --  1.2  --   PROT 6.7  --  6.7  --   ALBUMIN 2.5* 2.4* 2.3* 2.3*   No results for input(s): LIPASE, AMYLASE in the last 168 hours. Recent Labs  Lab 09/15/18 0706  AMMONIA 40*    CBC: Recent Labs  Lab 09/14/18 0649 09/15/18 0706 09/16/18 0107 09/17/18 0544  WBC 7.3 8.0 9.1 8.9  NEUTROABS 5.8  --   --   --   HGB 8.2* 7.8* 8.1* 8.2*  HCT 26.9* 24.3* 25.6* 25.8*  MCV 91.8 90.3 91.1 88.1  PLT 185 173 214 164    Cardiac Enzymes: Recent Labs  Lab 09/14/18 0649 09/15/18 0706 09/16/18 0107 09/16/18 0643 09/16/18 1227 09/16/18 1844 09/20/18 1148  CKTOTAL 38* 38* 40*  --   --   --  58  CKMB 4.2 4.4 4.1  --   --   --  4.1  TROPONINI  --   --  0.04* 0.06* 0.08* 0.08*  --     BNP (last 3 results) No results for input(s): BNP in the last 8760 hours.  ProBNP (last 3 results) No results for input(s): PROBNP in the last 8760 hours.  Radiological Exams: No results found.  Assessment/Plan Active Problems:   Acute on chronic respiratory failure with hypoxia (HCC)   Acute on chronic  diastolic heart failure (HCC)   Pulmonary hypertension (HCC)   Ascites of liver   MRSA bacteremia   1. Acute on chronic respiratory failure with hypoxia we will continue to wean on pressure support mode titrate oxygen as tolerated as noted above the goal is for 12 hours today. 2. Acute on chronic diastolic heart failure at baseline continue present management 3. Ascites we will monitor fluid status continue supportive care 4. Pulmonary hypertension on oxygen 5. MRSA bacteremia treated clinically improved hemodynamically stable   I have personally seen and evaluated the patient, evaluated laboratory and imaging results, formulated the assessment and plan and placed orders. The Patient requires high complexity decision making for assessment and support.  Case was discussed on Rounds with the Respiratory Therapy Staff  Yevonne Pax, MD [facility] Pulmonary Critical Care  Medicine Sleep Medicine

## 2018-09-21 DIAGNOSIS — J9621 Acute and chronic respiratory failure with hypoxia: Secondary | ICD-10-CM | POA: Diagnosis not present

## 2018-09-21 DIAGNOSIS — R188 Other ascites: Secondary | ICD-10-CM | POA: Diagnosis not present

## 2018-09-21 DIAGNOSIS — R7881 Bacteremia: Secondary | ICD-10-CM | POA: Diagnosis not present

## 2018-09-21 DIAGNOSIS — I5033 Acute on chronic diastolic (congestive) heart failure: Secondary | ICD-10-CM | POA: Diagnosis not present

## 2018-09-21 LAB — TROPONIN I: Troponin I: <0.03 ng/mL (ref <0.03)

## 2018-09-21 LAB — C-REACTIVE PROTEIN: CRP: 15.5 mg/dL — ABNORMAL HIGH (ref <1.0)

## 2018-09-21 LAB — SEDIMENTATION RATE: Sed Rate: 75 mm/hr — ABNORMAL HIGH (ref 0–16)

## 2018-09-21 NOTE — Progress Notes (Signed)
Pulmonary Critical Care Medicine [facility] [place]   PULMONARY CRITICAL CARE SERVICE  PROGRESS NOTE  Date of Service: 09/21/2018  Frank Levy  ESL:[id number]  DOB: Dec 12, 1950   DOA: 09/13/2018  Referring Physician: Carron Curie, MD  HPI: Frank Levy is a 68 y.o. male seen for follow up of Acute on Chronic Respiratory Failure.  At this time patient is on full support now pressure support mode wean protocol started the goal is for 16 hours  Medications: Reviewed on Rounds  Physical Exam:  Vitals: Temperature 96.0 pulse 50 respiratory 18 blood pressure 105/60 saturations 98%  Ventilator Settings mode ventilation pressure support FiO2 28% tidal volume 439 pressure support 12 PEEP 5  . General: Comfortable at this time . Eyes: Grossly normal lids, irises & conjunctiva . ENT: grossly tongue is normal . Neck: no obvious mass . Cardiovascular: S1 S2 normal no gallop . Respiratory: Scattered rhonchi expansion is equal . Abdomen: soft . Skin: no rash seen on limited exam . Musculoskeletal: not rigid . Psychiatric:unable to assess . Neurologic: no seizure no involuntary movements         Lab Data:   Basic Metabolic Panel: Recent Labs  Lab 09/15/18 0706 09/16/18 0107 09/17/18 0544  NA 143 141 143  K 3.7 4.5 3.8  CL 95* 95* 98  CO2 37* 33* 30  GLUCOSE 142* 189* 122*  BUN 65* 69* 74*  CREATININE 2.31* 2.89* 2.52*  CALCIUM 8.5* 8.2* 8.0*  MG 2.0 1.9 1.9  PHOS 4.1 5.1* 4.3    ABG: Recent Labs  Lab 09/16/18 0207  PHART 7.515*  PCO2ART 44.2  PO2ART 284*  HCO3 35.5*  O2SAT RESULTS ABOVE REPORTABLE RANGE    Liver Function Tests: Recent Labs  Lab 09/15/18 0706 09/16/18 0107 09/17/18 0544  AST  --  23  --   ALT  --  12  --   ALKPHOS  --  145*  --   BILITOT  --  1.2  --   PROT  --  6.7  --   ALBUMIN 2.4* 2.3* 2.3*   No results for input(s): LIPASE, AMYLASE in the last 168 hours. Recent Labs  Lab 09/15/18 0706  AMMONIA 40*     CBC: Recent Labs  Lab 09/15/18 0706 09/16/18 0107 09/17/18 0544  WBC 8.0 9.1 8.9  HGB 7.8* 8.1* 8.2*  HCT 24.3* 25.6* 25.8*  MCV 90.3 91.1 88.1  PLT 173 214 164    Cardiac Enzymes: Recent Labs  Lab 09/15/18 0706 09/16/18 0107 09/16/18 0643 09/16/18 1227 09/16/18 1844 09/20/18 1148  CKTOTAL 38* 40*  --   --   --  58  CKMB 4.4 4.1  --   --   --  4.1  TROPONINI  --  0.04* 0.06* 0.08* 0.08*  --     BNP (last 3 results) No results for input(s): BNP in the last 8760 hours.  ProBNP (last 3 results) No results for input(s): PROBNP in the last 8760 hours.  Radiological Exams: No results found.  Assessment/Plan Active Problems:   Acute on chronic respiratory failure with hypoxia (HCC)   Acute on chronic diastolic heart failure (HCC)   Pulmonary hypertension (HCC)   Ascites of liver   MRSA bacteremia   1. Acute on chronic respiratory failure with hypoxia we will continue with weaning on pressure support mode titrate oxygen continue pulmonary toilet as mentioned above the goal is for 16 hours 2. Acute on chronic diastolic heart failure compensated we will continue present management  3. Pulmonary hypertension on oxygen 4. Ascites monitor fluid status 5. MRSA bacteremia treated improved we will continue supportive care   I have personally seen and evaluated the patient, evaluated laboratory and imaging results, formulated the assessment and plan and placed orders. The Patient requires high complexity decision making for assessment and support.  Case was discussed on Rounds with the Respiratory Therapy Staff  Yevonne Pax, MD [facility] Pulmonary Critical Care Medicine Sleep Medicine

## 2018-09-22 ENCOUNTER — Other Ambulatory Visit (HOSPITAL_COMMUNITY): Payer: Medicare Other

## 2018-09-22 DIAGNOSIS — R7881 Bacteremia: Secondary | ICD-10-CM | POA: Diagnosis not present

## 2018-09-22 DIAGNOSIS — R188 Other ascites: Secondary | ICD-10-CM | POA: Diagnosis not present

## 2018-09-22 DIAGNOSIS — J9621 Acute and chronic respiratory failure with hypoxia: Secondary | ICD-10-CM | POA: Diagnosis not present

## 2018-09-22 DIAGNOSIS — I5033 Acute on chronic diastolic (congestive) heart failure: Secondary | ICD-10-CM | POA: Diagnosis not present

## 2018-09-22 NOTE — Progress Notes (Addendum)
Pulmonary Critical Care Medicine [facility] [place]   PULMONARY CRITICAL CARE SERVICE  PROGRESS NOTE  Date of Service: 09/22/2018  Frank Levy  IRS:[id number]  DOB: 1951/05/11   DOA: 09/13/2018  Referring Physician: Carron Curie, MD  HPI: Frank Levy is a 68 y.o. male seen for follow up of Acute on Chronic Respiratory Failure.  Patient remains intubated with endotracheal tube.  He was able to tolerate 16 hours on PSVT yesterday however patient failed SBT this morning pulling volumes less than 250 cc and his sat dropped to 78%.  Respiratory therapy tried again at noon with similar results.  He remains on full support on the monitor at this time.  Medications: Reviewed on Rounds  Physical Exam:  Vitals: Pulse 52 respirations 16 BP 127/67 O2 sat 100% 97.3  Ventilator Settings ventilator mode AC VC rate of 16 tidal volume 450 FiO2 20% PEEP 5  . General: Comfortable at this time . Eyes: Grossly normal lids, irises & conjunctiva . ENT: grossly tongue is normal . Neck: no obvious mass . Cardiovascular: S1 S2 normal no gallop . Respiratory: Scattered rhonchi equal expansion . Abdomen: soft . Skin: no rash seen on limited exam . Musculoskeletal: not rigid . Psychiatric:unable to assess . Neurologic: no seizure no involuntary movements         Lab Data:   Basic Metabolic Panel: Recent Labs  Lab 09/16/18 0107 09/17/18 0544  NA 141 143  K 4.5 3.8  CL 95* 98  CO2 33* 30  GLUCOSE 189* 122*  BUN 69* 74*  CREATININE 2.89* 2.52*  CALCIUM 8.2* 8.0*  MG 1.9 1.9  PHOS 5.1* 4.3    ABG: Recent Labs  Lab 09/16/18 0207  PHART 7.515*  PCO2ART 44.2  PO2ART 284*  HCO3 35.5*  O2SAT RESULTS ABOVE REPORTABLE RANGE    Liver Function Tests: Recent Labs  Lab 09/16/18 0107 09/17/18 0544  AST 23  --   ALT 12  --   ALKPHOS 145*  --   BILITOT 1.2  --   PROT 6.7  --   ALBUMIN 2.3* 2.3*   No results for input(s): LIPASE, AMYLASE in the last 168 hours. No  results for input(s): AMMONIA in the last 168 hours.  CBC: Recent Labs  Lab 09/16/18 0107 09/17/18 0544  WBC 9.1 8.9  HGB 8.1* 8.2*  HCT 25.6* 25.8*  MCV 91.1 88.1  PLT 214 164    Cardiac Enzymes: Recent Labs  Lab 09/16/18 0107 09/16/18 0643 09/16/18 1227 09/16/18 1844 09/20/18 1148 09/21/18 1328  CKTOTAL 40*  --   --   --  58  --   CKMB 4.1  --   --   --  4.1  --   TROPONINI 0.04* 0.06* 0.08* 0.08*  --  <0.03    BNP (last 3 results) No results for input(s): BNP in the last 8760 hours.  ProBNP (last 3 results) No results for input(s): PROBNP in the last 8760 hours.  Radiological Exams: Dg Abd Portable 1v  Result Date: 09/22/2018 CLINICAL DATA:  Ascites. EXAM: PORTABLE ABDOMEN - 1 VIEW COMPARISON:  Radiograph of September 16, 2018. FINDINGS: The bowel gas pattern is normal. No radio-opaque calculi or other significant radiographic abnormality are seen. Distal tip of nasogastric tube is seen in expected position of distal stomach or proximal duodenum. IMPRESSION: Distal tip of nasogastric tube seen in expected position of distal stomach or proximal duodenum. No evidence of bowel obstruction or ileus. Electronically Signed   By: Fayrene Fearing  Christen Butter, M.D.   On: 09/22/2018 17:18    Assessment/Plan Active Problems:   Acute on chronic respiratory failure with hypoxia (HCC)   Acute on chronic diastolic heart failure (HCC)   Pulmonary hypertension (HCC)   Ascites of liver   MRSA bacteremia   1. Acute on chronic respiratory failure with hypoxia continue with weaning on pressure support mode as patient can tolerate.  Continue to titrate oxygen.  Continue aggressive pulmonary toilet.  Goal today on pressure support 16 hours.  Although unable to place patient on pressure support due to failed SBT today. 2. Known chronic diastolic heart failure compensated continue his management 3. Pulmonary hypertension oxygen 4. Ascites monitor fluid status 5. MRSA bacteremia treated continue  supportive care   I have personally seen and evaluated the patient, evaluated laboratory and imaging results, formulated the assessment and plan and placed orders. The Patient requires high complexity decision making for assessment and support.  Case was discussed on Rounds with the Respiratory Therapy Staff  Yevonne Pax, MD [facility] Pulmonary Critical Care Medicine Sleep Medicine

## 2018-09-23 DIAGNOSIS — I5033 Acute on chronic diastolic (congestive) heart failure: Secondary | ICD-10-CM | POA: Diagnosis not present

## 2018-09-23 DIAGNOSIS — R188 Other ascites: Secondary | ICD-10-CM | POA: Diagnosis not present

## 2018-09-23 DIAGNOSIS — R7881 Bacteremia: Secondary | ICD-10-CM | POA: Diagnosis not present

## 2018-09-23 DIAGNOSIS — J9621 Acute and chronic respiratory failure with hypoxia: Secondary | ICD-10-CM | POA: Diagnosis not present

## 2018-09-23 LAB — BASIC METABOLIC PANEL
Anion gap: 11 (ref 5–15)
BUN: 65 mg/dL — ABNORMAL HIGH (ref 8–23)
CHLORIDE: 100 mmol/L (ref 98–111)
CO2: 25 mmol/L (ref 22–32)
Calcium: 7.7 mg/dL — ABNORMAL LOW (ref 8.9–10.3)
Creatinine, Ser: 2.4 mg/dL — ABNORMAL HIGH (ref 0.61–1.24)
GFR calc Af Amer: 31 mL/min — ABNORMAL LOW (ref >60)
GFR calc non Af Amer: 27 mL/min — ABNORMAL LOW (ref >60)
Glucose, Bld: 113 mg/dL — ABNORMAL HIGH (ref 70–99)
Potassium: 3.4 mmol/L — ABNORMAL LOW (ref 3.5–5.1)
Sodium: 136 mmol/L (ref 135–145)

## 2018-09-23 NOTE — Progress Notes (Addendum)
Pulmonary Critical Care Medicine [facility] [place]   PULMONARY CRITICAL CARE SERVICE  PROGRESS NOTE  Date of Service: 09/23/2018  Frank Levy  DJM:[id number]  DOB: 16-Oct-1950   DOA: 09/13/2018  Referring Physician: Carron Curie, MD  HPI: Frank Levy is a 68 y.o. male seen for follow up of Acute on Chronic Respiratory Failure.  Patient failed SBT yesterday however today past and is doing well on pressure support with a goal of 16 hours today.  Secretions are manageable.  Medications: Reviewed on Rounds  Physical Exam:  Vitals: Pulse 55 respirations 18 BP 136/60 O2 sat 100% temp 98.4  Ventilator Settings ventilator mode PSVT 12/5 FiO2 28%  . General: Comfortable at this time . Eyes: Grossly normal lids, irises & conjunctiva . ENT: grossly tongue is normal . Neck: no obvious mass . Cardiovascular: S1 S2 normal no gallop . Respiratory: Scattered rhonchi with equal expansion . Abdomen: soft . Skin: no rash seen on limited exam . Musculoskeletal: not rigid . Psychiatric:unable to assess . Neurologic: no seizure no involuntary movements         Lab Data:   Basic Metabolic Panel: Recent Labs  Lab 09/17/18 0544 09/23/18 0601  NA 143 136  K 3.8 3.4*  CL 98 100  CO2 30 25  GLUCOSE 122* 113*  BUN 74* 65*  CREATININE 2.52* 2.40*  CALCIUM 8.0* 7.7*  MG 1.9  --   PHOS 4.3  --     ABG: No results for input(s): PHART, PCO2ART, PO2ART, HCO3, O2SAT in the last 168 hours.  Liver Function Tests: Recent Labs  Lab 09/17/18 0544  ALBUMIN 2.3*   No results for input(s): LIPASE, AMYLASE in the last 168 hours. No results for input(s): AMMONIA in the last 168 hours.  CBC: Recent Labs  Lab 09/17/18 0544  WBC 8.9  HGB 8.2*  HCT 25.8*  MCV 88.1  PLT 164    Cardiac Enzymes: Recent Labs  Lab 09/16/18 1844 09/20/18 1148 09/21/18 1328  CKTOTAL  --  58  --   CKMB  --  4.1  --   TROPONINI 0.08*  --  <0.03    BNP (last 3 results) No results for  input(s): BNP in the last 8760 hours.  ProBNP (last 3 results) No results for input(s): PROBNP in the last 8760 hours.  Radiological Exams: Dg Abd Portable 1v  Result Date: 09/22/2018 CLINICAL DATA:  Ascites. EXAM: PORTABLE ABDOMEN - 1 VIEW COMPARISON:  Radiograph of September 16, 2018. FINDINGS: The bowel gas pattern is normal. No radio-opaque calculi or other significant radiographic abnormality are seen. Distal tip of nasogastric tube is seen in expected position of distal stomach or proximal duodenum. IMPRESSION: Distal tip of nasogastric tube seen in expected position of distal stomach or proximal duodenum. No evidence of bowel obstruction or ileus. Electronically Signed   By: Lupita Raider, M.D.   On: 09/22/2018 17:18    Assessment/Plan Active Problems:   Acute on chronic respiratory failure with hypoxia (HCC)   Acute on chronic diastolic heart failure (HCC)   Pulmonary hypertension (HCC)   Ascites of liver   MRSA bacteremia   1. Acute on chronic respiratory failure with hypoxia continue weaning on pressure support mode as patient can tolerate.  Continue to titrate oxygen.  Continue aggressive pulmonary toilet goal today is 16 hours on pressure support 12/5 FiO2 28%. 2. Known chronic diastolic heart failure compensated continue present management 3. Pulmonary hypertension on oxygen 4. Ascites monitor fluid status 5.  MRSA bacteremia treated continue supportive measures   I have personally seen and evaluated the patient, evaluated laboratory and imaging results, formulated the assessment and plan and placed orders. The Patient requires high complexity decision making for assessment and support.  Case was discussed on Rounds with the Respiratory Therapy Staff  Yevonne Pax, MD [facility] Pulmonary Critical Care Medicine Sleep Medicine

## 2018-09-24 DIAGNOSIS — R7881 Bacteremia: Secondary | ICD-10-CM | POA: Diagnosis not present

## 2018-09-24 DIAGNOSIS — I5033 Acute on chronic diastolic (congestive) heart failure: Secondary | ICD-10-CM | POA: Diagnosis not present

## 2018-09-24 DIAGNOSIS — J9621 Acute and chronic respiratory failure with hypoxia: Secondary | ICD-10-CM | POA: Diagnosis not present

## 2018-09-24 DIAGNOSIS — R188 Other ascites: Secondary | ICD-10-CM | POA: Diagnosis not present

## 2018-09-24 LAB — BASIC METABOLIC PANEL
Anion gap: 9 (ref 5–15)
BUN: 66 mg/dL — ABNORMAL HIGH (ref 8–23)
CO2: 24 mmol/L (ref 22–32)
Calcium: 7.8 mg/dL — ABNORMAL LOW (ref 8.9–10.3)
Chloride: 103 mmol/L (ref 98–111)
Creatinine, Ser: 2.45 mg/dL — ABNORMAL HIGH (ref 0.61–1.24)
GFR calc Af Amer: 30 mL/min — ABNORMAL LOW (ref >60)
GFR calc non Af Amer: 26 mL/min — ABNORMAL LOW (ref >60)
Glucose, Bld: 115 mg/dL — ABNORMAL HIGH (ref 70–99)
Potassium: 3.7 mmol/L (ref 3.5–5.1)
Sodium: 136 mmol/L (ref 135–145)

## 2018-09-24 NOTE — Progress Notes (Signed)
Pulmonary Critical Care Medicine [facility] [place]   PULMONARY CRITICAL CARE SERVICE  PROGRESS NOTE  Date of Service: 09/24/2018  Frank Levy  CBJ:[id number]  DOB: 12/16/1950   DOA: 09/13/2018  Referring Physician: Carron Curie, MD  HPI: Frank Levy is a 68 y.o. male seen for follow up of Acute on Chronic Respiratory Failure.  Patient is on pressure support mode at this time currently on 28% FiO2 with a volume of 412 cc.  The goal for the wean today is about 20 hours  Medications: Reviewed on Rounds  Physical Exam:  Vitals: Temperature 98.0 pulse 73 respiratory rate 20 blood pressure 139/72 saturations 100%  Ventilator Settings mode ventilation pressure support FiO2 28% tidal volume 412 pressure support 12 PEEP 5  . General: Comfortable at this time . Eyes: Grossly normal lids, irises & conjunctiva . ENT: grossly tongue is normal . Neck: no obvious mass . Cardiovascular: S1 S2 normal no gallop . Respiratory: No rhonchi or rales are noted at this time . Abdomen: soft . Skin: no rash seen on limited exam . Musculoskeletal: not rigid . Psychiatric:unable to assess . Neurologic: no seizure no involuntary movements         Lab Data:   Basic Metabolic Panel: Recent Labs  Lab 09/23/18 0601 09/24/18 1227  NA 136 136  K 3.4* 3.7  CL 100 103  CO2 25 24  GLUCOSE 113* 115*  BUN 65* 66*  CREATININE 2.40* 2.45*  CALCIUM 7.7* 7.8*    ABG: No results for input(s): PHART, PCO2ART, PO2ART, HCO3, O2SAT in the last 168 hours.  Liver Function Tests: No results for input(s): AST, ALT, ALKPHOS, BILITOT, PROT, ALBUMIN in the last 168 hours. No results for input(s): LIPASE, AMYLASE in the last 168 hours. No results for input(s): AMMONIA in the last 168 hours.  CBC: No results for input(s): WBC, NEUTROABS, HGB, HCT, MCV, PLT in the last 168 hours.  Cardiac Enzymes: Recent Labs  Lab 09/20/18 1148 09/21/18 1328  CKTOTAL 58  --   CKMB 4.1  --   TROPONINI   --  <0.03    BNP (last 3 results) No results for input(s): BNP in the last 8760 hours.  ProBNP (last 3 results) No results for input(s): PROBNP in the last 8760 hours.  Radiological Exams: Dg Abd Portable 1v  Result Date: 09/22/2018 CLINICAL DATA:  Ascites. EXAM: PORTABLE ABDOMEN - 1 VIEW COMPARISON:  Radiograph of September 16, 2018. FINDINGS: The bowel gas pattern is normal. No radio-opaque calculi or other significant radiographic abnormality are seen. Distal tip of nasogastric tube is seen in expected position of distal stomach or proximal duodenum. IMPRESSION: Distal tip of nasogastric tube seen in expected position of distal stomach or proximal duodenum. No evidence of bowel obstruction or ileus. Electronically Signed   By: Lupita Raider, M.D.   On: 09/22/2018 17:18    Assessment/Plan Active Problems:   Acute on chronic respiratory failure with hypoxia (HCC)   Acute on chronic diastolic heart failure (HCC)   Pulmonary hypertension (HCC)   Ascites of liver   MRSA bacteremia   1. Acute on chronic respiratory failure with hypoxia we will continue to wean for 20 hours on pressure support 2. Acute on chronic diastolic heart failure currently is compensated continue present therapy 3. Pulmonary hypertension on oxygen therapy 4. Ascites follow fluid status 5. MRSA bacteremia treated improved   I have personally seen and evaluated the patient, evaluated laboratory and imaging results, formulated the assessment and  plan and placed orders. The Patient requires high complexity decision making for assessment and support.  Case was discussed on Rounds with the Respiratory Therapy Staff  Allyne Gee, MD [facility] Pulmonary Critical Care Medicine Sleep Medicine

## 2018-09-25 ENCOUNTER — Other Ambulatory Visit (HOSPITAL_COMMUNITY): Payer: Medicare Other

## 2018-09-25 DIAGNOSIS — R7881 Bacteremia: Secondary | ICD-10-CM | POA: Diagnosis not present

## 2018-09-25 DIAGNOSIS — J9621 Acute and chronic respiratory failure with hypoxia: Secondary | ICD-10-CM | POA: Diagnosis not present

## 2018-09-25 DIAGNOSIS — R188 Other ascites: Secondary | ICD-10-CM | POA: Diagnosis not present

## 2018-09-25 DIAGNOSIS — I5033 Acute on chronic diastolic (congestive) heart failure: Secondary | ICD-10-CM | POA: Diagnosis not present

## 2018-09-25 LAB — BASIC METABOLIC PANEL
Anion gap: 11 (ref 5–15)
BUN: 67 mg/dL — ABNORMAL HIGH (ref 8–23)
CALCIUM: 8.1 mg/dL — AB (ref 8.9–10.3)
CO2: 24 mmol/L (ref 22–32)
Chloride: 99 mmol/L (ref 98–111)
Creatinine, Ser: 2.29 mg/dL — ABNORMAL HIGH (ref 0.61–1.24)
GFR calc Af Amer: 33 mL/min — ABNORMAL LOW (ref >60)
GFR calc non Af Amer: 28 mL/min — ABNORMAL LOW (ref >60)
Glucose, Bld: 119 mg/dL — ABNORMAL HIGH (ref 70–99)
Potassium: 4.2 mmol/L (ref 3.5–5.1)
Sodium: 134 mmol/L — ABNORMAL LOW (ref 135–145)

## 2018-09-25 NOTE — Progress Notes (Signed)
Pulmonary Critical Care Medicine [facility] [place]   PULMONARY CRITICAL CARE SERVICE  PROGRESS NOTE  Date of Service: 09/25/2018  Frank Levy  YYF:[id number]  DOB: Oct 07, 1950   DOA: 09/13/2018  Referring Physician: Carron Curie, MD  HPI: Frank Levy is a 68 y.o. male seen for follow up of Acute on Chronic Respiratory Failure.  Patient is weaning on pressure support today the goal is for 24 hours currently is on 28% FiO2 with good saturations  Medications: Reviewed on Rounds  Physical Exam:  Vitals: Temperature 96.9 pulse 64 respiratory rate 24 blood pressure 130/75 saturations 100%  Ventilator Settings mode ventilation pressure support FiO2 28% tidal volume 418 pressure support 12 PEEP 5  . General: Comfortable at this time . Eyes: Grossly normal lids, irises & conjunctiva . ENT: grossly tongue is normal . Neck: no obvious mass . Cardiovascular: S1 S2 normal no gallop . Respiratory: No rhonchi or rales are noted at this time . Abdomen: soft . Skin: no rash seen on limited exam . Musculoskeletal: not rigid . Psychiatric:unable to assess . Neurologic: no seizure no involuntary movements         Lab Data:   Basic Metabolic Panel: Recent Labs  Lab 09/23/18 0601 09/24/18 1227 09/25/18 0525  NA 136 136 134*  K 3.4* 3.7 4.2  CL 100 103 99  CO2 25 24 24   GLUCOSE 113* 115* 119*  BUN 65* 66* 67*  CREATININE 2.40* 2.45* 2.29*  CALCIUM 7.7* 7.8* 8.1*    ABG: No results for input(s): PHART, PCO2ART, PO2ART, HCO3, O2SAT in the last 168 hours.  Liver Function Tests: No results for input(s): AST, ALT, ALKPHOS, BILITOT, PROT, ALBUMIN in the last 168 hours. No results for input(s): LIPASE, AMYLASE in the last 168 hours. No results for input(s): AMMONIA in the last 168 hours.  CBC: No results for input(s): WBC, NEUTROABS, HGB, HCT, MCV, PLT in the last 168 hours.  Cardiac Enzymes: Recent Labs  Lab 09/20/18 1148 09/21/18 1328  CKTOTAL 58  --    CKMB 4.1  --   TROPONINI  --  <0.03    BNP (last 3 results) No results for input(s): BNP in the last 8760 hours.  ProBNP (last 3 results) No results for input(s): PROBNP in the last 8760 hours.  Radiological Exams: [place] Ascites (abdomen Limited)  Result Date: 09/25/2018 CLINICAL DATA:  Evaluate ascites. Paracentesis performed on 09/16/2018. EXAM: LIMITED ABDOMEN ULTRASOUND FOR ASCITES TECHNIQUE: Limited ultrasound survey for ascites was performed in all four abdominal quadrants. COMPARISON:  09/16/2018, 09/15/2018. FINDINGS: Moderate to large amount of ascites throughout the abdomen, the largest pocket in the LEFT LOWER QUADRANT. Cirrhotic liver again noted. IMPRESSION: Recurrent moderate to large amount of ascites throughout the abdomen, the largest pocket in the LEFT LOWER QUADRANT. Electronically Signed   By: Hulan Saas M.D.   On: 09/25/2018 14:27    Assessment/Plan Active Problems:   Acute on chronic respiratory failure with hypoxia (HCC)   Acute on chronic diastolic heart failure (HCC)   Pulmonary hypertension (HCC)   Ascites of liver   MRSA bacteremia   1. Acute on chronic respiratory failure with hypoxia we will continue with full support on pressure support titrate oxygen continue pulmonary toilet 2. Acute on chronic diastolic heart failure compensated we will continue present management 3. Pulmonary hypertension at baseline we will continue supportive care 4. Ascites of liver 5. MRSA bacteremia treated acutely   I have personally seen and evaluated the patient, evaluated laboratory and  imaging results, formulated the assessment and plan and placed orders. The Patient requires high complexity decision making for assessment and support.  Case was discussed on Rounds with the Respiratory Therapy Staff  Allyne Gee, MD [facility] Pulmonary Critical Care Medicine Sleep Medicine

## 2018-09-26 ENCOUNTER — Other Ambulatory Visit (HOSPITAL_COMMUNITY): Payer: Medicare Other

## 2018-09-26 DIAGNOSIS — R7881 Bacteremia: Secondary | ICD-10-CM | POA: Diagnosis not present

## 2018-09-26 DIAGNOSIS — J9621 Acute and chronic respiratory failure with hypoxia: Secondary | ICD-10-CM | POA: Diagnosis not present

## 2018-09-26 DIAGNOSIS — I5033 Acute on chronic diastolic (congestive) heart failure: Secondary | ICD-10-CM | POA: Diagnosis not present

## 2018-09-26 DIAGNOSIS — R188 Other ascites: Secondary | ICD-10-CM | POA: Diagnosis not present

## 2018-09-26 LAB — BASIC METABOLIC PANEL
Anion gap: 12 (ref 5–15)
BUN: 66 mg/dL — AB (ref 8–23)
CO2: 23 mmol/L (ref 22–32)
Calcium: 8 mg/dL — ABNORMAL LOW (ref 8.9–10.3)
Chloride: 99 mmol/L (ref 98–111)
Creatinine, Ser: 2.2 mg/dL — ABNORMAL HIGH (ref 0.61–1.24)
GFR calc Af Amer: 34 mL/min — ABNORMAL LOW (ref >60)
GFR calc non Af Amer: 30 mL/min — ABNORMAL LOW (ref >60)
GLUCOSE: 128 mg/dL — AB (ref 70–99)
Potassium: 4.1 mmol/L (ref 3.5–5.1)
Sodium: 134 mmol/L — ABNORMAL LOW (ref 135–145)

## 2018-09-26 LAB — MAGNESIUM: Magnesium: 2.1 mg/dL (ref 1.7–2.4)

## 2018-09-26 LAB — CBC
HCT: 20.6 % — ABNORMAL LOW (ref 39.0–52.0)
Hemoglobin: 6.4 g/dL — CL (ref 13.0–17.0)
MCH: 28.2 pg (ref 26.0–34.0)
MCHC: 31.1 g/dL (ref 30.0–36.0)
MCV: 90.7 fL (ref 80.0–100.0)
Platelets: 236 10*3/uL (ref 150–400)
RBC: 2.27 MIL/uL — ABNORMAL LOW (ref 4.22–5.81)
RDW: 15.4 % (ref 11.5–15.5)
WBC: 9.3 10*3/uL (ref 4.0–10.5)
nRBC: 0 % (ref 0.0–0.2)

## 2018-09-26 LAB — ABO/RH: ABO/RH(D): O POS

## 2018-09-26 LAB — PREPARE RBC (CROSSMATCH)

## 2018-09-26 LAB — ECHOCARDIOGRAM COMPLETE

## 2018-09-26 MED ORDER — LIDOCAINE HCL (PF) 1 % IJ SOLN
INTRAMUSCULAR | Status: AC
  Filled 2018-09-26: qty 30

## 2018-09-26 NOTE — Progress Notes (Signed)
  Echocardiogram 2D Echocardiogram has been performed.  Frank Levy M 09/26/2018, 9:40 AM

## 2018-09-26 NOTE — Progress Notes (Addendum)
[facility] [place]   PULMONARY CRITICAL CARE SERVICE  PROGRESS NOTE  Date of Service: 09/26/2018  Gwenevere AbbotMichael A Kimm  WUJ:[id number]  DOB: 1950/12/07   DOA: 09/13/2018  Referring Physician: Carron CurieAli Hijazi, MD  HPI: Gwenevere AbbotMichael A Melling is a 68 y.o. male seen for follow up of Acute on Chronic Respiratory Failure.  Patient remained on pressure support today 12/5 with an FiO2 of 28%.  No weaning was done today because this morning's hemoglobin showed a hemoglobin of 6.4 so the patient has received 2 units packed red cells over the day today.  We will continue with weaning tomorrow as patient will tolerate.  Medications: Reviewed on Rounds  Physical Exam:  Vitals: Pulse 66 respirations 24 BP 129/61 O2 sat 100% temp 97.3  Ventilator Settings ventilator mode pressure support 12/5 FiO2 28%  . General: Comfortable at this time . Eyes: Grossly normal lids, irises & conjunctiva . ENT: grossly tongue is normal . Neck: no obvious mass . Cardiovascular: S1 S2 normal no gallop . Respiratory: Coarse presents . Abdomen: soft . Skin: no rash seen on limited exam . Musculoskeletal: not rigid . Psychiatric:unable to assess . Neurologic: no seizure no involuntary movements         Lab Data:   Basic Metabolic Panel: Recent Labs  Lab 09/23/18 0601 09/24/18 1227 09/25/18 0525 09/26/18 0506  NA 136 136 134* 134*  K 3.4* 3.7 4.2 4.1  CL 100 103 99 99  CO2 25 24 24 23   GLUCOSE 113* 115* 119* 128*  BUN 65* 66* 67* 66*  CREATININE 2.40* 2.45* 2.29* 2.20*  CALCIUM 7.7* 7.8* 8.1* 8.0*  MG  --   --   --  2.1    ABG: No results for input(s): PHART, PCO2ART, PO2ART, HCO3, O2SAT in the last 168 hours.  Liver Function Tests: No results for input(s): AST, ALT, ALKPHOS, BILITOT, PROT, ALBUMIN in the last 168 hours. No results for input(s): LIPASE, AMYLASE in the last 168 hours. No results for input(s): AMMONIA in the last 168 hours.  CBC: Recent Labs  Lab  09/26/18 0506  WBC 9.3  HGB 6.4*  HCT 20.6*  MCV 90.7  PLT 236    Cardiac Enzymes: Recent Labs  Lab 09/20/18 1148 09/21/18 1328  CKTOTAL 58  --   CKMB 4.1  --   TROPONINI  --  <0.03    BNP (last 3 results) No results for input(s): BNP in the last 8760 hours.  ProBNP (last 3 results) No results for input(s): PROBNP in the last 8760 hours.  Radiological Exams: [place] Ascites (abdomen Limited)  Result Date: 09/25/2018 CLINICAL DATA:  Evaluate ascites. Paracentesis performed on 09/16/2018. EXAM: LIMITED ABDOMEN ULTRASOUND FOR ASCITES TECHNIQUE: Limited ultrasound survey for ascites was performed in all four abdominal quadrants. COMPARISON:  09/16/2018, 09/15/2018. FINDINGS: Moderate to large amount of ascites throughout the abdomen, the largest pocket in the LEFT LOWER QUADRANT. Cirrhotic liver again noted. IMPRESSION: Recurrent moderate to large amount of ascites throughout the abdomen, the largest pocket in the LEFT LOWER QUADRANT. Electronically Signed   By: Hulan Saashomas  Lawrence M.D.   On: 09/25/2018 14:27    Assessment/Plan Active Problems:   Acute on chronic respiratory failure with hypoxia (HCC)   Acute on chronic diastolic heart failure (HCC)   Pulmonary hypertension (HCC)   Ascites of liver   MRSA bacteremia   1. Acute on chronic respiratory failure with hypoxia continue with full support via pressure support and titrate oxygen as tolerated, restarting  tomorrow after patient has had some time to rest from receiving blood products today. 2. Acute on chronic diastolic heart failure compensated continue present management 3. Pulmonary hypertension at baseline continue supportive care 4. Ascites of liver monitor fluid status 5. MRSA bacteremia treated acutely   I have personally seen and evaluated the patient, evaluated laboratory and imaging results, formulated the assessment and plan and placed orders. The Patient requires high complexity decision making for assessment and  support.  Case was discussed on Rounds with the Respiratory Therapy Staff  Yevonne Pax, MD [facility] Pulmonary Critical Care Medicine Sleep Medicine

## 2018-09-27 ENCOUNTER — Other Ambulatory Visit (HOSPITAL_COMMUNITY): Payer: Medicare Other

## 2018-09-27 DIAGNOSIS — I5033 Acute on chronic diastolic (congestive) heart failure: Secondary | ICD-10-CM | POA: Diagnosis not present

## 2018-09-27 DIAGNOSIS — R188 Other ascites: Secondary | ICD-10-CM | POA: Diagnosis not present

## 2018-09-27 DIAGNOSIS — J9621 Acute and chronic respiratory failure with hypoxia: Secondary | ICD-10-CM | POA: Diagnosis not present

## 2018-09-27 DIAGNOSIS — R7881 Bacteremia: Secondary | ICD-10-CM | POA: Diagnosis not present

## 2018-09-27 LAB — BPAM RBC
Blood Product Expiration Date: 202003102359
Blood Product Expiration Date: 202003102359
ISSUE DATE / TIME: 202002121045
ISSUE DATE / TIME: 202002121420
Unit Type and Rh: 5100
Unit Type and Rh: 5100

## 2018-09-27 LAB — CBC
HCT: 29.2 % — ABNORMAL LOW (ref 39.0–52.0)
HEMOGLOBIN: 9.3 g/dL — AB (ref 13.0–17.0)
MCH: 27.8 pg (ref 26.0–34.0)
MCHC: 31.8 g/dL (ref 30.0–36.0)
MCV: 87.2 fL (ref 80.0–100.0)
Platelets: 236 10*3/uL (ref 150–400)
RBC: 3.35 MIL/uL — ABNORMAL LOW (ref 4.22–5.81)
RDW: 16.9 % — ABNORMAL HIGH (ref 11.5–15.5)
WBC: 12.6 10*3/uL — ABNORMAL HIGH (ref 4.0–10.5)
nRBC: 0 % (ref 0.0–0.2)

## 2018-09-27 LAB — TYPE AND SCREEN
ABO/RH(D): O POS
Antibody Screen: NEGATIVE
Unit division: 0
Unit division: 0

## 2018-09-27 NOTE — Procedures (Signed)
PROCEDURE SUMMARY:  Successful image-guided paracentesis from the right lower abdomen.  Yielded 7.2 liters of golden yellow fluid.  No immediate complications.  EBL: zero Patient tolerated well.   Specimen was not sent for labs.  Please see imaging section of Epic for full dictation.  Villa Herb PA-C 09/27/2018 2:46 PM

## 2018-09-27 NOTE — Progress Notes (Addendum)
[facility] [place]   PULMONARY CRITICAL CARE SERVICE  PROGRESS NOTE  Date of Service: 09/27/2018  Frank AbbotMichael A Levy  ZOX:[id number]  DOB: Oct 26, 1950   DOA: 09/13/2018  Referring Physician: Carron CurieAli Hijazi, MD  HPI: Frank Levy is a 68 y.o. male seen for follow up of Acute on Chronic Respiratory Failure.  Patient had paracentesis today with a removed 7.2 L of fluid.  He remains endotracheally intubated on pressure support at this time with an FiO2 of 28%.  He is on pressure support for 72 hours.  Medications: Reviewed on Rounds  Physical Exam:  Vitals: Pulse 86 respiration 24 BP 134/67 O2 sat 93% temp 98.6  Ventilator Settings ventilator mode pressure support 12/5 28% FiO2  . General: Comfortable at this time . Eyes: Grossly normal lids, irises & conjunctiva . ENT: grossly tongue is normal . Neck: no obvious mass . Cardiovascular: S1 S2 normal no gallop . Respiratory: Coarse breath sounds . Abdomen: soft . Skin: no rash seen on limited exam . Musculoskeletal: not rigid . Psychiatric:unable to assess . Neurologic: no seizure no involuntary movements         Lab Data:   Basic Metabolic Panel: Recent Labs  Lab 09/23/18 0601 09/24/18 1227 09/25/18 0525 09/26/18 0506  NA 136 136 134* 134*  K 3.4* 3.7 4.2 4.1  CL 100 103 99 99  CO2 25 24 24 23   GLUCOSE 113* 115* 119* 128*  BUN 65* 66* 67* 66*  CREATININE 2.40* 2.45* 2.29* 2.20*  CALCIUM 7.7* 7.8* 8.1* 8.0*  MG  --   --   --  2.1    ABG: No results for input(s): PHART, PCO2ART, PO2ART, HCO3, O2SAT in the last 168 hours.  Liver Function Tests: No results for input(s): AST, ALT, ALKPHOS, BILITOT, PROT, ALBUMIN in the last 168 hours. No results for input(s): LIPASE, AMYLASE in the last 168 hours. No results for input(s): AMMONIA in the last 168 hours.  CBC: Recent Labs  Lab 09/26/18 0506 09/27/18 0622  WBC 9.3 12.6*  HGB 6.4* 9.3*  HCT 20.6* 29.2*  MCV 90.7 87.2   PLT 236 236    Cardiac Enzymes: Recent Labs  Lab 09/21/18 1328  TROPONINI <0.03    BNP (last 3 results) No results for input(s): BNP in the last 8760 hours.  ProBNP (last 3 results) No results for input(s): PROBNP in the last 8760 hours.  Radiological Exams: No results found.  Assessment/Plan Active Problems:   Acute on chronic respiratory failure with hypoxia (HCC)   Acute on chronic diastolic heart failure (HCC)   Pulmonary hypertension (HCC)   Ascites of liver   MRSA bacteremia   1. Acute on chronic respiratory failure with hypoxia continue pressure support via ventilator.  Continue to titrate oxygen as tolerated and continue weaning protocol. 2. Acute on chronic diastolic heart failure compensated continue present management 3. Pulmonary hypertension at baseline continue supportive care 4. Ascites of liver monitor fluid status 5. MRSA bacteremia treated acutely   I have personally seen and evaluated the patient, evaluated laboratory and imaging results, formulated the assessment and plan and placed orders. The Patient requires high complexity decision making for assessment and support.  Case was discussed on Rounds with the Respiratory Therapy Staff  Yevonne PaxSaadat A , MD [facility] Pulmonary Critical Care Medicine Sleep Medicine

## 2018-09-28 DIAGNOSIS — R7881 Bacteremia: Secondary | ICD-10-CM | POA: Diagnosis not present

## 2018-09-28 DIAGNOSIS — R188 Other ascites: Secondary | ICD-10-CM | POA: Diagnosis not present

## 2018-09-28 DIAGNOSIS — J9621 Acute and chronic respiratory failure with hypoxia: Secondary | ICD-10-CM | POA: Diagnosis not present

## 2018-09-28 DIAGNOSIS — I5033 Acute on chronic diastolic (congestive) heart failure: Secondary | ICD-10-CM | POA: Diagnosis not present

## 2018-09-28 LAB — BASIC METABOLIC PANEL
Anion gap: 7 (ref 5–15)
BUN: 65 mg/dL — ABNORMAL HIGH (ref 8–23)
CO2: 25 mmol/L (ref 22–32)
Calcium: 8 mg/dL — ABNORMAL LOW (ref 8.9–10.3)
Chloride: 103 mmol/L (ref 98–111)
Creatinine, Ser: 2.02 mg/dL — ABNORMAL HIGH (ref 0.61–1.24)
GFR calc Af Amer: 38 mL/min — ABNORMAL LOW (ref >60)
GFR calc non Af Amer: 33 mL/min — ABNORMAL LOW (ref >60)
Glucose, Bld: 152 mg/dL — ABNORMAL HIGH (ref 70–99)
Potassium: 4 mmol/L (ref 3.5–5.1)
Sodium: 135 mmol/L (ref 135–145)

## 2018-09-28 NOTE — Progress Notes (Addendum)
Pulmonary Critical Care Medicine [facility] [place]   PULMONARY CRITICAL CARE SERVICE  PROGRESS NOTE  Date of Service: 09/28/2018  Frank Levy  [id number]:[id number]  DOB: April 12, 1951   DOA: 09/13/2018  Referring Physician: Carron Curie, MD  HPI: Frank Levy is a 68 y.o. male seen for follow up of Acute on Chronic Respiratory Failure.  Patient remains endotracheally intubated at this time.  He remains on pressure support with an FiO2 28%.  Medications: Reviewed on Rounds  Physical Exam:  Vitals: Pulse 68 respirations 20 BP 131/70 O2 sat 99% temp 97.6  Ventilator Settings ventilator mode pressure support 12/5 FiO2 28%  . General: Comfortable at this time . Eyes: Grossly normal lids, irises & conjunctiva . ENT: grossly tongue is normal . Neck: no obvious mass . Cardiovascular: S1 S2 normal no gallop . Respiratory: Coarse breath sounds . Abdomen: soft . Skin: no rash seen on limited exam . Musculoskeletal: not rigid . Psychiatric:unable to assess . Neurologic: no seizure no involuntary movements         Lab Data:   Basic Metabolic Panel: Recent Labs  Lab 09/23/18 0601 09/24/18 1227 09/25/18 0525 09/26/18 0506 09/28/18 0427  NA 136 136 134* 134* 135  K 3.4* 3.7 4.2 4.1 4.0  CL 100 103 99 99 103  CO2 25 24 24 23 25   GLUCOSE 113* 115* 119* 128* 152*  BUN 65* 66* 67* 66* 65*  CREATININE 2.40* 2.45* 2.29* 2.20* 2.02*  CALCIUM 7.7* 7.8* 8.1* 8.0* 8.0*  MG  --   --   --  2.1  --     ABG: No results for input(s): PHART, PCO2ART, PO2ART, HCO3, O2SAT in the last 168 hours.  Liver Function Tests: No results for input(s): AST, ALT, ALKPHOS, BILITOT, PROT, ALBUMIN in the last 168 hours. No results for input(s): LIPASE, AMYLASE in the last 168 hours. No results for input(s): AMMONIA in the last 168 hours.  CBC: Recent Labs  Lab 09/26/18 0506 09/27/18 0622  WBC 9.3 12.6*  HGB 6.4* 9.3*  HCT 20.6* 29.2*  MCV 90.7 87.2  PLT 236 236    Cardiac  Enzymes: No results for input(s): CKTOTAL, CKMB, CKMBINDEX, TROPONINI in the last 168 hours.  BNP (last 3 results) No results for input(s): BNP in the last 8760 hours.  ProBNP (last 3 results) No results for input(s): PROBNP in the last 8760 hours.  Radiological Exams: US Paracentesis  Result Date: 09/27/2018 INDICATION: Patient with history of respiratory failure on mechanical ventilation, CHF, cirrhosis, recurrent ascites. Request for therapeutic paracentesis at bedside. EXAM: ULTRASOUND GUIDED THERAPEUTIC PARACENTESIS MEDICATIONS: 10 mL 1% lidocaine. COMPLICATIONS: None immediate. PROCEDURE: Informed written consent was obtained from the patient after a discussion of the risks, benefits and alternatives to treatment. A timeout was performed prior to the initiation of the procedure. Initial ultrasound scanning demonstrates a large amount of ascites within the right lower abdominal quadrant. The right lower abdomen was prepped and draped in the usual sterile fashion. 1% lidocaine was used for local anesthesia. Following this, a 19 gauge, 7-cm, Yueh catheter was introduced. An ultrasound image was saved for documentation purposes. The paracentesis was performed. The catheter was removed and a dressing was applied. The patient tolerated the procedure well without immediate post procedural complication. FINDINGS: A total of approximately 7.2 L of golden yellow fluid was removed. IMPRESSION: Successful ultrasound-guided paracentesis yielding 7.2 liters of peritoneal fluid. Read by Lynnette Caffey, PA-C Electronically Signed   By: Meriel Pica.D.  On: 09/27/2018 14:56   Dg Chest Port 1 View  Result Date: 09/27/2018 CLINICAL DATA:  Pleural effusion. EXAM: PORTABLE CHEST 1 VIEW COMPARISON:  09/17/2018 FINDINGS: Support devices are stable. Cardiomegaly. Worsening bilateral airspace disease, likely edema. Small layering effusions. No acute bony abnormality. IMPRESSION: Worsening edema/CHF.  Small  bilateral effusions. Electronically Signed   By: Charlett Nose M.D.   On: 09/27/2018 19:24    Assessment/Plan Active Problems:   Acute on chronic respiratory failure with hypoxia (HCC)   Acute on chronic diastolic heart failure (HCC)   Pulmonary hypertension (HCC)   Ascites of liver   MRSA bacteremia   1. Acute on chronic respiratory failure with hypoxia continue pressure support on ventilator.  Continue to titrate oxygen as tolerated and continue weaning per protocol.  Continue supportive measures. 2. Acute on chronic diastolic heart failure compensated continue present management 3. Pulmonary hypertension at baseline continue supportive care 4. Ascites of liver monitor fluid status 5. MRSA bacteremia treated acutely   I have personally seen and evaluated the patient, evaluated laboratory and imaging results, formulated the assessment and plan and placed orders. The Patient requires high complexity decision making for assessment and support.  Case was discussed on Rounds with the Respiratory Therapy Staff  Yevonne Pax, MD [facility] Pulmonary Critical Care Medicine Sleep Medicine

## 2018-09-29 DIAGNOSIS — I5033 Acute on chronic diastolic (congestive) heart failure: Secondary | ICD-10-CM | POA: Diagnosis not present

## 2018-09-29 DIAGNOSIS — J9621 Acute and chronic respiratory failure with hypoxia: Secondary | ICD-10-CM | POA: Diagnosis not present

## 2018-09-29 DIAGNOSIS — R7881 Bacteremia: Secondary | ICD-10-CM | POA: Diagnosis not present

## 2018-09-29 DIAGNOSIS — R188 Other ascites: Secondary | ICD-10-CM | POA: Diagnosis not present

## 2018-09-29 LAB — COMPREHENSIVE METABOLIC PANEL
ALT: 11 U/L (ref 0–44)
AST: 21 U/L (ref 15–41)
Albumin: 1.6 g/dL — ABNORMAL LOW (ref 3.5–5.0)
Alkaline Phosphatase: 128 U/L — ABNORMAL HIGH (ref 38–126)
Anion gap: 9 (ref 5–15)
BUN: 64 mg/dL — AB (ref 8–23)
CO2: 24 mmol/L (ref 22–32)
Calcium: 8.1 mg/dL — ABNORMAL LOW (ref 8.9–10.3)
Chloride: 102 mmol/L (ref 98–111)
Creatinine, Ser: 1.88 mg/dL — ABNORMAL HIGH (ref 0.61–1.24)
GFR calc Af Amer: 42 mL/min — ABNORMAL LOW (ref >60)
GFR, EST NON AFRICAN AMERICAN: 36 mL/min — AB (ref >60)
Glucose, Bld: 142 mg/dL — ABNORMAL HIGH (ref 70–99)
Potassium: 4 mmol/L (ref 3.5–5.1)
Sodium: 135 mmol/L (ref 135–145)
Total Bilirubin: 0.6 mg/dL (ref 0.3–1.2)
Total Protein: 5.6 g/dL — ABNORMAL LOW (ref 6.5–8.1)

## 2018-09-29 NOTE — Progress Notes (Addendum)
Pulmonary Critical Care Medicine [facility] [place]   PULMONARY CRITICAL CARE SERVICE  PROGRESS NOTE  Date of Service: 09/29/2018  Frank Levy  WUJ:[id number]  DOB: 01-Dec-1950   DOA: 09/13/2018  Referring Physician: Carron Curie, MD  HPI: Frank Levy is a 68 y.o. male seen for follow up of Acute on Chronic Respiratory Failure.  Patient remains endotracheally intubated at this time.  He remains on pressure support 12/5 with FiO2 28%.  Patient will most likely need ENT consultation for trach placement.  Medications: Reviewed on Rounds  Physical Exam:  Vitals: Pulse 66 respirations 20 BP 132/60 O2 sat 98% temp 97.2  Ventilator Settings mode mode pressure support 12/5 FiO2 28%  . General: Comfortable at this time . Eyes: Grossly normal lids, irises & conjunctiva . ENT: grossly tongue is normal . Neck: no obvious mass . Cardiovascular: S1 S2 normal no gallop . Respiratory: Coarse breath sounds . Abdomen: soft . Skin: no rash seen on limited exam . Musculoskeletal: not rigid . Psychiatric:unable to assess . Neurologic: no seizure no involuntary movements         Lab Data:   Basic Metabolic Panel: Recent Labs  Lab 09/24/18 1227 09/25/18 0525 09/26/18 0506 09/28/18 0427 09/29/18 0846  NA 136 134* 134* 135 135  K 3.7 4.2 4.1 4.0 4.0  CL 103 99 99 103 102  CO2 24 24 23 25 24   GLUCOSE 115* 119* 128* 152* 142*  BUN 66* 67* 66* 65* 64*  CREATININE 2.45* 2.29* 2.20* 2.02* 1.88*  CALCIUM 7.8* 8.1* 8.0* 8.0* 8.1*  MG  --   --  2.1  --   --     ABG: No results for input(s): PHART, PCO2ART, PO2ART, HCO3, O2SAT in the last 168 hours.  Liver Function Tests: Recent Labs  Lab 09/29/18 0846  AST 21  ALT 11  ALKPHOS 128*  BILITOT 0.6  PROT 5.6*  ALBUMIN 1.6*   No results for input(s): LIPASE, AMYLASE in the last 168 hours. No results for input(s): AMMONIA in the last 168 hours.  CBC: Recent Labs  Lab 09/26/18 0506 09/27/18 0622  WBC 9.3 12.6*   HGB 6.4* 9.3*  HCT 20.6* 29.2*  MCV 90.7 87.2  PLT 236 236    Cardiac Enzymes: No results for input(s): CKTOTAL, CKMB, CKMBINDEX, TROPONINI in the last 168 hours.  BNP (last 3 results) No results for input(s): BNP in the last 8760 hours.  ProBNP (last 3 results) No results for input(s): PROBNP in the last 8760 hours.  Radiological Exams: Dg Chest Port 1 View  Result Date: 09/27/2018 CLINICAL DATA:  Pleural effusion. EXAM: PORTABLE CHEST 1 VIEW COMPARISON:  09/17/2018 FINDINGS: Support devices are stable. Cardiomegaly. Worsening bilateral airspace disease, likely edema. Small layering effusions. No acute bony abnormality. IMPRESSION: Worsening edema/CHF.  Small bilateral effusions. Electronically Signed   By: Charlett Nose M.D.   On: 09/27/2018 19:24    Assessment/Plan Active Problems:   Acute on chronic respiratory failure with hypoxia (HCC)   Acute on chronic diastolic heart failure (HCC)   Pulmonary hypertension (HCC)   Ascites of liver   MRSA bacteremia   1. Acute on chronic respiratory failure hypoxia continue pressure support on ventilator.  Continue supportive measures.  Patient unable to wean any further likely will need tracheostomy placement. 2. Acute on chronic diastolic heart failure compensated continue present management 3. Pulmonary hypertension at baseline continue supportive care 4. Ascites of liver monitor fluid status 5. MRSA bacteremia treated acutely  I have personally seen and evaluated the patient, evaluated laboratory and imaging results, formulated the assessment and plan and placed orders. The Patient requires high complexity decision making for assessment and support.  Case was discussed on Rounds with the Respiratory Therapy Staff  Allyne Gee, MD [facility] Pulmonary Critical Care Medicine Sleep Medicine

## 2018-09-30 DIAGNOSIS — R7881 Bacteremia: Secondary | ICD-10-CM | POA: Diagnosis not present

## 2018-09-30 DIAGNOSIS — I5033 Acute on chronic diastolic (congestive) heart failure: Secondary | ICD-10-CM | POA: Diagnosis not present

## 2018-09-30 DIAGNOSIS — R188 Other ascites: Secondary | ICD-10-CM | POA: Diagnosis not present

## 2018-09-30 DIAGNOSIS — J9621 Acute and chronic respiratory failure with hypoxia: Secondary | ICD-10-CM | POA: Diagnosis not present

## 2018-09-30 LAB — CULTURE, BLOOD (ROUTINE X 2)
Culture: NO GROWTH
Culture: NO GROWTH

## 2018-09-30 NOTE — Progress Notes (Addendum)
Pulmonary Critical Care Medicine [facility] [place]   PULMONARY CRITICAL CARE SERVICE  PROGRESS NOTE  Date of Service: 09/30/2018  Frank Levy  [id number]  DOB: 05-16-1951   DOA: 09/13/2018  Referring Physician: Carron Curie, MD  HPI: Frank Levy is a 68 y.o. male seen for follow up of Acute on Chronic Respiratory Failure.  Patient remains on pressure support 12/5 with an FiO2 28% through his endotracheal tube.  ENT is aware of patient.  Medications: Reviewed on Rounds  Physical Exam:  Vitals: Pulse 66 respiration 17 BP 126/65 O2 sat 94% temp 98.3  Ventilator Settings patient's not currently on ventilator  . General: Comfortable at this time . Eyes: Grossly normal lids, irises & conjunctiva . ENT: grossly tongue is normal . Neck: no obvious mass . Cardiovascular: S1 S2 normal no gallop . Respiratory: Coarse breath sounds . Abdomen: soft . Skin: no rash seen on limited exam . Musculoskeletal: not rigid . Psychiatric:unable to assess . Neurologic: no seizure no involuntary movements         Lab Data:   Basic Metabolic Panel: Recent Labs  Lab 09/24/18 1227 09/25/18 0525 09/26/18 0506 09/28/18 0427 09/29/18 0846  NA 136 134* 134* 135 135  K 3.7 4.2 4.1 4.0 4.0  CL 103 99 99 103 102  CO2 24 24 23 25 24   GLUCOSE 115* 119* 128* 152* 142*  BUN 66* 67* 66* 65* 64*  CREATININE 2.45* 2.29* 2.20* 2.02* 1.88*  CALCIUM 7.8* 8.1* 8.0* 8.0* 8.1*  MG  --   --  2.1  --   --     ABG: No results for input(s): PHART, PCO2ART, PO2ART, HCO3, O2SAT in the last 168 hours.  Liver Function Tests: Recent Labs  Lab 09/29/18 0846  AST 21  ALT 11  ALKPHOS 128*  BILITOT 0.6  PROT 5.6*  ALBUMIN 1.6*   No results for input(s): LIPASE, AMYLASE in the last 168 hours. No results for input(s): AMMONIA in the last 168 hours.  CBC: Recent Labs  Lab 09/26/18 0506 09/27/18 0622  WBC 9.3 12.6*  HGB 6.4* 9.3*  HCT 20.6* 29.2*  MCV 90.7 87.2  PLT 236 236     Cardiac Enzymes: No results for input(s): CKTOTAL, CKMB, CKMBINDEX, TROPONINI in the last 168 hours.  BNP (last 3 results) No results for input(s): BNP in the last 8760 hours.  ProBNP (last 3 results) No results for input(s): PROBNP in the last 8760 hours.  Radiological Exams: No results found.  Assessment/Plan Active Problems:   Acute on chronic respiratory failure with hypoxia (HCC)   Acute on chronic diastolic heart failure (HCC)   Pulmonary hypertension (HCC)   Ascites of liver   MRSA bacteremia   1. Acute on chronic respiratory failure with hypoxia continue pressure support on ventilator.  Patient is unable to wean any further and ENT is involved for possible tracheostomy placement.  Continue supportive measures at this time. 2. Acute on chronic diastolic heart failure compensated continue present management 3. Pulmonary hypertension at baseline continue supportive care 4. Ascites of liver monitor fluid status 5. MRSA bacteremia treated acutely   I have personally seen and evaluated the patient, evaluated laboratory and imaging results, formulated the assessment and plan and placed orders. The Patient requires high complexity decision making for assessment and support.  Case was discussed on Rounds with the Respiratory Therapy Staff  Yevonne Pax, MD [facility] Pulmonary Critical Care Medicine Sleep Medicine

## 2018-10-01 ENCOUNTER — Other Ambulatory Visit (HOSPITAL_COMMUNITY): Payer: Medicare Other

## 2018-10-01 DIAGNOSIS — I5033 Acute on chronic diastolic (congestive) heart failure: Secondary | ICD-10-CM | POA: Diagnosis not present

## 2018-10-01 DIAGNOSIS — R7881 Bacteremia: Secondary | ICD-10-CM | POA: Diagnosis not present

## 2018-10-01 DIAGNOSIS — R188 Other ascites: Secondary | ICD-10-CM | POA: Diagnosis not present

## 2018-10-01 DIAGNOSIS — J9621 Acute and chronic respiratory failure with hypoxia: Secondary | ICD-10-CM | POA: Diagnosis not present

## 2018-10-01 LAB — BASIC METABOLIC PANEL
Anion gap: 9 (ref 5–15)
BUN: 58 mg/dL — AB (ref 8–23)
CO2: 29 mmol/L (ref 22–32)
Calcium: 8 mg/dL — ABNORMAL LOW (ref 8.9–10.3)
Chloride: 99 mmol/L (ref 98–111)
Creatinine, Ser: 1.72 mg/dL — ABNORMAL HIGH (ref 0.61–1.24)
GFR calc Af Amer: 46 mL/min — ABNORMAL LOW (ref >60)
GFR, EST NON AFRICAN AMERICAN: 40 mL/min — AB (ref >60)
Glucose, Bld: 179 mg/dL — ABNORMAL HIGH (ref 70–99)
Potassium: 3.8 mmol/L (ref 3.5–5.1)
Sodium: 137 mmol/L (ref 135–145)

## 2018-10-01 LAB — CBC
HCT: 27.4 % — ABNORMAL LOW (ref 39.0–52.0)
Hemoglobin: 8.8 g/dL — ABNORMAL LOW (ref 13.0–17.0)
MCH: 28.1 pg (ref 26.0–34.0)
MCHC: 32.1 g/dL (ref 30.0–36.0)
MCV: 87.5 fL (ref 80.0–100.0)
PLATELETS: 193 10*3/uL (ref 150–400)
RBC: 3.13 MIL/uL — ABNORMAL LOW (ref 4.22–5.81)
RDW: 16.2 % — ABNORMAL HIGH (ref 11.5–15.5)
WBC: 15.7 10*3/uL — ABNORMAL HIGH (ref 4.0–10.5)
nRBC: 0 % (ref 0.0–0.2)

## 2018-10-01 LAB — URINALYSIS, ROUTINE W REFLEX MICROSCOPIC
Bilirubin Urine: NEGATIVE
Glucose, UA: NEGATIVE mg/dL
Ketones, ur: NEGATIVE mg/dL
Nitrite: NEGATIVE
Protein, ur: 30 mg/dL — AB
Specific Gravity, Urine: 1.01 (ref 1.005–1.030)
WBC, UA: >50 WBC/hpf — ABNORMAL HIGH (ref 0–5)
pH: 5 (ref 5.0–8.0)

## 2018-10-01 LAB — MAGNESIUM: Magnesium: 2.1 mg/dL (ref 1.7–2.4)

## 2018-10-01 LAB — SEDIMENTATION RATE: Sed Rate: 53 mm/hr — ABNORMAL HIGH (ref 0–16)

## 2018-10-01 LAB — C-REACTIVE PROTEIN: CRP: 7.6 mg/dL — ABNORMAL HIGH (ref <1.0)

## 2018-10-01 NOTE — Progress Notes (Signed)
Pulmonary Critical Care Medicine [facility] [place]   PULMONARY CRITICAL CARE SERVICE  PROGRESS NOTE  Date of Service: 10/01/2018  Frank Levy  [id number]  DOB: 03/22/1951   DOA: 09/13/2018  Referring Physician: Carron Curie, MD  HPI: Frank Levy is a 68 y.o. male seen for follow up of Acute on Chronic Respiratory Failure.  Patient is on pressure support wean has not been able to be weaned completely to a point where we can extubate him confidently so therefore requested ENT consultation for surgical tracheostomy to be done.  Medications: Reviewed on Rounds  Physical Exam:  Vitals: Temperature 99.9 pulse 100 respiratory rate 28 blood pressure 148/79 saturations 94%  Ventilator Settings remains orally intubated on pressure support FiO2 28% pressure support 12 PEEP 5 tidal volume 414  . General: Comfortable at this time . Eyes: Grossly normal lids, irises & conjunctiva . ENT: grossly tongue is normal . Neck: no obvious mass . Cardiovascular: S1 S2 normal no gallop . Respiratory: Scattered distant rhonchi are noted at this time . Abdomen: soft . Skin: no rash seen on limited exam . Musculoskeletal: not rigid . Psychiatric:unable to assess . Neurologic: no seizure no involuntary movements         Lab Data:   Basic Metabolic Panel: Recent Labs  Lab 09/25/18 0525 09/26/18 0506 09/28/18 0427 09/29/18 0846 10/01/18 0639  NA 134* 134* 135 135 137  K 4.2 4.1 4.0 4.0 3.8  CL 99 99 103 102 99  CO2 24 23 25 24 29   GLUCOSE 119* 128* 152* 142* 179*  BUN 67* 66* 65* 64* 58*  CREATININE 2.29* 2.20* 2.02* 1.88* 1.72*  CALCIUM 8.1* 8.0* 8.0* 8.1* 8.0*  MG  --  2.1  --   --  2.1    ABG: No results for input(s): PHART, PCO2ART, PO2ART, HCO3, O2SAT in the last 168 hours.  Liver Function Tests: Recent Labs  Lab 09/29/18 0846  AST 21  ALT 11  ALKPHOS 128*  BILITOT 0.6  PROT 5.6*  ALBUMIN 1.6*   No results for input(s): LIPASE, AMYLASE in the last  168 hours. No results for input(s): AMMONIA in the last 168 hours.  CBC: Recent Labs  Lab 09/26/18 0506 09/27/18 0622 10/01/18 0639  WBC 9.3 12.6* 15.7*  HGB 6.4* 9.3* 8.8*  HCT 20.6* 29.2* 27.4*  MCV 90.7 87.2 87.5  PLT 236 236 193    Cardiac Enzymes: No results for input(s): CKTOTAL, CKMB, CKMBINDEX, TROPONINI in the last 168 hours.  BNP (last 3 results) No results for input(s): BNP in the last 8760 hours.  ProBNP (last 3 results) No results for input(s): PROBNP in the last 8760 hours.  Radiological Exams: Dg Chest Port 1 View  Result Date: 10/01/2018 CLINICAL DATA:  Pulmonary edema. EXAM: PORTABLE CHEST 1 VIEW COMPARISON:  Radiograph of September 27, 2018. FINDINGS: Stable cardiomegaly with central pulmonary vascular congestion. Atherosclerosis of thoracic aorta is noted. Endotracheal and nasogastric tubes are in grossly good position. Right-sided PICC line is noted and unchanged in position. No pneumothorax is noted. Mild bibasilar atelectasis or edema is noted with small pleural effusions. IMPRESSION: Stable support apparatus. Stable bibasilar atelectasis or edema with small pleural effusions. Aortic Atherosclerosis (ICD10-I70.0). Electronically Signed   By: Lupita Raider, M.D.   On: 10/01/2018 08:26    Assessment/Plan Active Problems:   Acute on chronic respiratory failure with hypoxia (HCC)   Acute on chronic diastolic heart failure (HCC)   Pulmonary hypertension (HCC)   Ascites of  liver   MRSA bacteremia   1. Acute on chronic respiratory failure with hypoxia patient is not ready for weaning and extubation at this point.  ENT consultation has been requested for tracheostomy. 2. Acute on chronic diastolic heart failure at baseline continue with supportive care 3. Pulmonary hypertension continue oxygen therapy supportive care 4. Ascites stable we will monitor 5. MRSA bacteremia treated currently afebrile hemodynamically stable   I have personally seen and  evaluated the patient, evaluated laboratory and imaging results, formulated the assessment and plan and placed orders. The Patient requires high complexity decision making for assessment and support.  Case was discussed on Rounds with the Respiratory Therapy Staff  Yevonne Pax, MD [facility] Pulmonary Critical Care Medicine Sleep Medicine

## 2018-10-02 ENCOUNTER — Other Ambulatory Visit (HOSPITAL_COMMUNITY): Payer: Medicare Other

## 2018-10-02 DIAGNOSIS — R7881 Bacteremia: Secondary | ICD-10-CM | POA: Diagnosis not present

## 2018-10-02 DIAGNOSIS — J9621 Acute and chronic respiratory failure with hypoxia: Secondary | ICD-10-CM | POA: Diagnosis not present

## 2018-10-02 DIAGNOSIS — I5033 Acute on chronic diastolic (congestive) heart failure: Secondary | ICD-10-CM | POA: Diagnosis not present

## 2018-10-02 DIAGNOSIS — R188 Other ascites: Secondary | ICD-10-CM | POA: Diagnosis not present

## 2018-10-02 LAB — CBC
HCT: 26.8 % — ABNORMAL LOW (ref 39.0–52.0)
Hemoglobin: 8.8 g/dL — ABNORMAL LOW (ref 13.0–17.0)
MCH: 28.5 pg (ref 26.0–34.0)
MCHC: 32.8 g/dL (ref 30.0–36.0)
MCV: 86.7 fL (ref 80.0–100.0)
Platelets: 201 10*3/uL (ref 150–400)
RBC: 3.09 MIL/uL — ABNORMAL LOW (ref 4.22–5.81)
RDW: 16.5 % — ABNORMAL HIGH (ref 11.5–15.5)
WBC: 10.7 10*3/uL — ABNORMAL HIGH (ref 4.0–10.5)
nRBC: 0 % (ref 0.0–0.2)

## 2018-10-02 LAB — RENAL FUNCTION PANEL
Albumin: 1.9 g/dL — ABNORMAL LOW (ref 3.5–5.0)
Anion gap: 9 (ref 5–15)
BUN: 56 mg/dL — ABNORMAL HIGH (ref 8–23)
CO2: 31 mmol/L (ref 22–32)
Calcium: 8.4 mg/dL — ABNORMAL LOW (ref 8.9–10.3)
Chloride: 98 mmol/L (ref 98–111)
Creatinine, Ser: 1.64 mg/dL — ABNORMAL HIGH (ref 0.61–1.24)
GFR calc Af Amer: 49 mL/min — ABNORMAL LOW (ref >60)
GFR calc non Af Amer: 42 mL/min — ABNORMAL LOW (ref >60)
Glucose, Bld: 151 mg/dL — ABNORMAL HIGH (ref 70–99)
Phosphorus: 3.7 mg/dL (ref 2.5–4.6)
Potassium: 4.3 mmol/L (ref 3.5–5.1)
Sodium: 138 mmol/L (ref 135–145)

## 2018-10-02 LAB — MAGNESIUM: Magnesium: 2.1 mg/dL (ref 1.7–2.4)

## 2018-10-02 NOTE — Progress Notes (Signed)
Pulmonary Critical Care Medicine [facility] [place]   PULMONARY CRITICAL CARE SERVICE  PROGRESS NOTE  Date of Service: 10/02/2018  Frank Levy  [id number]  DOB: February 06, 1951   DOA: 09/13/2018  Referring Physician: Carron Curie, MD  HPI: Frank Levy is a 68 y.o. male seen for follow up of Acute on Chronic Respiratory Failure.  He continues to do fine on pressure support has been on it now for over a week.  Patient will need to have a cuff leak and I think we will give him a trial of extubation.  If the patient fails then will be reintubated and will obviously need a tracheostomy at that point.  Medications: Reviewed on Rounds  Physical Exam:  Vitals: Temperature 97.2 pulse 72 respiratory 18 blood pressure 145/81 saturations 99%  Ventilator Settings mode ventilation pressure support FiO2 28% pressure support 12 PEEP 5  . General: Comfortable at this time . Eyes: Grossly normal lids, irises & conjunctiva . ENT: grossly tongue is normal . Neck: no obvious mass . Cardiovascular: S1 S2 normal no gallop . Respiratory: Scattered rhonchi expansion is equal . Abdomen: soft . Skin: no rash seen on limited exam . Musculoskeletal: not rigid . Psychiatric:unable to assess . Neurologic: no seizure no involuntary movements         Lab Data:   Basic Metabolic Panel: Recent Labs  Lab 09/26/18 0506 09/28/18 0427 09/29/18 0846 10/01/18 0639 10/02/18 0545  NA 134* 135 135 137 138  K 4.1 4.0 4.0 3.8 4.3  CL 99 103 102 99 98  CO2 23 25 24 29 31   GLUCOSE 128* 152* 142* 179* 151*  BUN 66* 65* 64* 58* 56*  CREATININE 2.20* 2.02* 1.88* 1.72* 1.64*  CALCIUM 8.0* 8.0* 8.1* 8.0* 8.4*  MG 2.1  --   --  2.1 2.1  PHOS  --   --   --   --  3.7    ABG: No results for input(s): PHART, PCO2ART, PO2ART, HCO3, O2SAT in the last 168 hours.  Liver Function Tests: Recent Labs  Lab 09/29/18 0846 10/02/18 0545  AST 21  --   ALT 11  --   ALKPHOS 128*  --   BILITOT 0.6  --    PROT 5.6*  --   ALBUMIN 1.6* 1.9*   No results for input(s): LIPASE, AMYLASE in the last 168 hours. No results for input(s): AMMONIA in the last 168 hours.  CBC: Recent Labs  Lab 09/26/18 0506 09/27/18 0622 10/01/18 0639 10/02/18 0545  WBC 9.3 12.6* 15.7* 10.7*  HGB 6.4* 9.3* 8.8* 8.8*  HCT 20.6* 29.2* 27.4* 26.8*  MCV 90.7 87.2 87.5 86.7  PLT 236 236 193 201    Cardiac Enzymes: No results for input(s): CKTOTAL, CKMB, CKMBINDEX, TROPONINI in the last 168 hours.  BNP (last 3 results) No results for input(s): BNP in the last 8760 hours.  ProBNP (last 3 results) No results for input(s): PROBNP in the last 8760 hours.  Radiological Exams: Dg Chest Port 1 View  Result Date: 10/02/2018 CLINICAL DATA:  Short of breath.  Follow-up exam. EXAM: PORTABLE CHEST 1 VIEW COMPARISON:  10/01/2018 and older studies. FINDINGS: Opacity at both lung bases is consistent with bilateral effusions with associated parenchymal opacity, likely atelectasis. Lungs show prominent bronchovascular markings, without change. No pneumothorax. Endotracheal tube, nasal/orogastric tube and right PICC are stable. IMPRESSION: 1. No significant change from the previous day's study. Persistent bilateral pleural effusions with associated lung base parenchymal opacity, most likely atelectasis. 2.  Prominent bronchovascular markings/vascular congestion with probable interstitial pulmonary edema. Electronically Signed   By: Amie Portland M.D.   On: 10/02/2018 09:35   Dg Chest Port 1 View  Result Date: 10/01/2018 CLINICAL DATA:  Pulmonary edema. EXAM: PORTABLE CHEST 1 VIEW COMPARISON:  Radiograph of September 27, 2018. FINDINGS: Stable cardiomegaly with central pulmonary vascular congestion. Atherosclerosis of thoracic aorta is noted. Endotracheal and nasogastric tubes are in grossly good position. Right-sided PICC line is noted and unchanged in position. No pneumothorax is noted. Mild bibasilar atelectasis or edema is noted  with small pleural effusions. IMPRESSION: Stable support apparatus. Stable bibasilar atelectasis or edema with small pleural effusions. Aortic Atherosclerosis (ICD10-I70.0). Electronically Signed   By: Lupita Raider, M.D.   On: 10/01/2018 08:26    Assessment/Plan Active Problems:   Acute on chronic respiratory failure with hypoxia (HCC)   Acute on chronic diastolic heart failure (HCC)   Pulmonary hypertension (HCC)   Ascites of liver   MRSA bacteremia   1. Acute on chronic respiratory failure with hypoxia we will proceed to extubation trial and placed patient on BiPAP after extubation. 2. Acute on chronic diastolic heart failure improved we will continue with supportive care. 3. Pulmonary hypertension at baseline continue with oxygen therapy 4. MRSA bacteremia resolved continue with supportive care 5. Ascites we will monitor and tap as necessary prognosis guarded   I have personally seen and evaluated the patient, evaluated laboratory and imaging results, formulated the assessment and plan and placed orders. The Patient requires high complexity decision making for assessment and support.  Case was discussed on Rounds with the Respiratory Therapy Staff  Yevonne Pax, MD [facility] Pulmonary Critical Care Medicine Sleep Medicine

## 2018-10-03 ENCOUNTER — Other Ambulatory Visit (HOSPITAL_COMMUNITY): Payer: Medicare Other

## 2018-10-03 DIAGNOSIS — I5033 Acute on chronic diastolic (congestive) heart failure: Secondary | ICD-10-CM | POA: Diagnosis not present

## 2018-10-03 DIAGNOSIS — J9621 Acute and chronic respiratory failure with hypoxia: Secondary | ICD-10-CM | POA: Diagnosis not present

## 2018-10-03 DIAGNOSIS — R188 Other ascites: Secondary | ICD-10-CM | POA: Diagnosis not present

## 2018-10-03 DIAGNOSIS — R7881 Bacteremia: Secondary | ICD-10-CM | POA: Diagnosis not present

## 2018-10-03 LAB — BLOOD GAS, ARTERIAL
Acid-Base Excess: 6.7 mmol/L — ABNORMAL HIGH (ref 0.0–2.0)
Bicarbonate: 31.3 mmol/L — ABNORMAL HIGH (ref 20.0–28.0)
O2 Content: 2 L/min
O2 Saturation: 99.3 %
PCO2 ART: 49.7 mmHg — AB (ref 32.0–48.0)
Patient temperature: 98.6
pH, Arterial: 7.415 (ref 7.350–7.450)
pO2, Arterial: 134 mmHg — ABNORMAL HIGH (ref 83.0–108.0)

## 2018-10-03 LAB — CULTURE, RESPIRATORY W GRAM STAIN

## 2018-10-03 LAB — URINE CULTURE

## 2018-10-03 NOTE — Progress Notes (Signed)
Pulmonary Critical Care Medicine [facility] [place]   PULMONARY CRITICAL CARE SERVICE  PROGRESS NOTE  Date of Service: 10/03/2018  Frank Levy  [id number]  DOB: [birth year]   DOA: 09/13/2018  Referring Physician: Carron Curie, MD  HPI: Frank Levy is a 68 y.o. male seen for follow up of Acute on Chronic Respiratory Failure.  Patient is doing fine extubated did well since extubation yesterday.  Medications: Reviewed on Rounds  Physical Exam:  Vitals: Temperature 97.9 pulse 73 respiratory 20 blood pressure 157/67 saturations 97%  Ventilator Settings extubated  . General: Comfortable at this time . Eyes: Grossly normal lids, irises & conjunctiva . ENT: grossly tongue is normal . Neck: no obvious mass . Cardiovascular: S1 S2 normal no gallop . Respiratory: Scattered rhonchi expansion is equal . Abdomen: soft . Skin: no rash seen on limited exam . Musculoskeletal: not rigid . Psychiatric:unable to assess . Neurologic: no seizure no involuntary movements         Lab Data:   Basic Metabolic Panel: Recent Labs  Lab 09/28/18 0427 09/29/18 0846 10/01/18 0639 10/02/18 0545  NA 135 135 137 138  K 4.0 4.0 3.8 4.3  CL 103 102 99 98  CO2 25 24 29 31   GLUCOSE 152* 142* 179* 151*  BUN 65* 64* 58* 56*  CREATININE 2.02* 1.88* 1.72* 1.64*  CALCIUM 8.0* 8.1* 8.0* 8.4*  MG  --   --  2.1 2.1  PHOS  --   --   --  3.7    ABG: Recent Labs  Lab 10/03/18 1005  PHART 7.415  PCO2ART 49.7*  PO2ART 134*  HCO3 31.3*  O2SAT 99.3    Liver Function Tests: Recent Labs  Lab 09/29/18 0846 10/02/18 0545  AST 21  --   ALT 11  --   ALKPHOS 128*  --   BILITOT 0.6  --   PROT 5.6*  --   ALBUMIN 1.6* 1.9*   No results for input(s): LIPASE, AMYLASE in the last 168 hours. No results for input(s): AMMONIA in the last 168 hours.  CBC: Recent Labs  Lab 09/27/18 0622 10/01/18 0639 10/02/18 0545  WBC 12.6* 15.7* 10.7*  HGB 9.3* 8.8* 8.8*  HCT 29.2* 27.4*  26.8*  MCV 87.2 87.5 86.7  PLT 236 193 201    Cardiac Enzymes: No results for input(s): CKTOTAL, CKMB, CKMBINDEX, TROPONINI in the last 168 hours.  BNP (last 3 results) No results for input(s): BNP in the last 8760 hours.  ProBNP (last 3 results) No results for input(s): PROBNP in the last 8760 hours.  Radiological Exams: Dg Chest Port 1 View  Result Date: 10/03/2018 CLINICAL DATA:  Pleural effusion follow-up EXAM: PORTABLE CHEST 1 VIEW COMPARISON:  Yesterday FINDINGS: Interval tracheal extubation. The nasoenteric tube is in good position. Right upper extremity PICC with tip at the SVC. Layering pleural effusions greater on the right. Vascular congestion and borderline heart size. No pneumothorax. IMPRESSION: Layering pleural effusions, vascular congestion, and presumed atelectasis. No change from yesterday. Electronically Signed   By: Marnee Spring M.D.   On: 10/03/2018 09:26   Dg Chest Port 1 View  Result Date: 10/02/2018 CLINICAL DATA:  Short of breath.  Follow-up exam. EXAM: PORTABLE CHEST 1 VIEW COMPARISON:  10/01/2018 and older studies. FINDINGS: Opacity at both lung bases is consistent with bilateral effusions with associated parenchymal opacity, likely atelectasis. Lungs show prominent bronchovascular markings, without change. No pneumothorax. Endotracheal tube, nasal/orogastric tube and right PICC are stable. IMPRESSION: 1. No significant  change from the previous day's study. Persistent bilateral pleural effusions with associated lung base parenchymal opacity, most likely atelectasis. 2. Prominent bronchovascular markings/vascular congestion with probable interstitial pulmonary edema. Electronically Signed   By: Amie Portland M.D.   On: 10/02/2018 09:35    Assessment/Plan Active Problems:   Acute on chronic respiratory failure with hypoxia (HCC)   Acute on chronic diastolic heart failure (HCC)   Pulmonary hypertension (HCC)   Ascites of liver   MRSA bacteremia   1. Acute  on chronic respiratory failure with hypoxia we will continue with oxygen therapy as necessary.  Patient did have a blood gas done ordered today as noted above which looks excellent we will continue to use the BiPAP as necessary. 2. Acute on chronic diastolic heart failure continue with supportive care diuretics as tolerated 3. Pulmonary hypertension oxygen therapy 4. Ascites we will need paracentesis 5. MRSA bacteremia treated clinically improving   I have personally seen and evaluated the patient, evaluated laboratory and imaging results, formulated the assessment and plan and placed orders. The Patient requires high complexity decision making for assessment and support.  Case was discussed on Rounds with the Respiratory Therapy Staff  Yevonne Pax, MD [facility] Pulmonary Critical Care Medicine Sleep Medicine

## 2018-10-04 DIAGNOSIS — R188 Other ascites: Secondary | ICD-10-CM | POA: Diagnosis not present

## 2018-10-04 DIAGNOSIS — I5033 Acute on chronic diastolic (congestive) heart failure: Secondary | ICD-10-CM | POA: Diagnosis not present

## 2018-10-04 DIAGNOSIS — R7881 Bacteremia: Secondary | ICD-10-CM | POA: Diagnosis not present

## 2018-10-04 DIAGNOSIS — J9621 Acute and chronic respiratory failure with hypoxia: Secondary | ICD-10-CM | POA: Diagnosis not present

## 2018-10-04 LAB — BASIC METABOLIC PANEL WITH GFR
Anion gap: 7 (ref 5–15)
BUN: 49 mg/dL — ABNORMAL HIGH (ref 8–23)
CO2: 32 mmol/L (ref 22–32)
Calcium: 8.1 mg/dL — ABNORMAL LOW (ref 8.9–10.3)
Chloride: 97 mmol/L — ABNORMAL LOW (ref 98–111)
Creatinine, Ser: 1.48 mg/dL — ABNORMAL HIGH (ref 0.61–1.24)
GFR calc Af Amer: 56 mL/min — ABNORMAL LOW (ref >60)
GFR calc non Af Amer: 48 mL/min — ABNORMAL LOW (ref >60)
Glucose, Bld: 140 mg/dL — ABNORMAL HIGH (ref 70–99)
Potassium: 3.7 mmol/L (ref 3.5–5.1)
Sodium: 136 mmol/L (ref 135–145)

## 2018-10-04 LAB — CBC
HCT: 25.8 % — ABNORMAL LOW (ref 39.0–52.0)
Hemoglobin: 8.1 g/dL — ABNORMAL LOW (ref 13.0–17.0)
MCH: 27.4 pg (ref 26.0–34.0)
MCHC: 31.4 g/dL (ref 30.0–36.0)
MCV: 87.2 fL (ref 80.0–100.0)
Platelets: 178 K/uL (ref 150–400)
RBC: 2.96 MIL/uL — ABNORMAL LOW (ref 4.22–5.81)
RDW: 16.1 % — ABNORMAL HIGH (ref 11.5–15.5)
WBC: 10.3 K/uL (ref 4.0–10.5)
nRBC: 0 % (ref 0.0–0.2)

## 2018-10-04 LAB — PHOSPHORUS: Phosphorus: 3.7 mg/dL (ref 2.5–4.6)

## 2018-10-04 LAB — OCCULT BLOOD X 1 CARD TO LAB, STOOL: Fecal Occult Bld: INVALID

## 2018-10-04 LAB — MAGNESIUM: Magnesium: 2 mg/dL (ref 1.7–2.4)

## 2018-10-04 NOTE — Progress Notes (Signed)
Pulmonary Critical Care Medicine [facility] [place]   PULMONARY CRITICAL CARE SERVICE  PROGRESS NOTE  Date of Service: 10/04/2018  Frank Levy  [id number]  DOB: 16-Jan-1951   DOA: 09/13/2018  Referring Physician: Carron Curie, MD  HPI: Frank Levy is a 68 y.o. male seen for follow up of Acute on Chronic Respiratory Failure.  Doing well right now is on 1 L doing fairly well no distress is noted  Medications: Reviewed on Rounds  Physical Exam:  Vitals: Temperature 97.4 pulse 77 respiratory 24 blood pressure 139/59 saturations 96%  Ventilator Settings on 1 L at this time  . General: Comfortable at this time . Eyes: Grossly normal lids, irises & conjunctiva . ENT: grossly tongue is normal . Neck: no obvious mass . Cardiovascular: S1 S2 normal no gallop . Respiratory: No rhonchi or rales are noted at this time . Abdomen: soft . Skin: no rash seen on limited exam . Musculoskeletal: not rigid . Psychiatric:unable to assess . Neurologic: no seizure no involuntary movements         Lab Data:   Basic Metabolic Panel: Recent Labs  Lab 09/28/18 0427 09/29/18 0846 10/01/18 0639 10/02/18 0545 10/04/18 0522  NA 135 135 137 138 136  K 4.0 4.0 3.8 4.3 3.7  CL 103 102 99 98 97*  CO2 25 24 29 31  32  GLUCOSE 152* 142* 179* 151* 140*  BUN 65* 64* 58* 56* 49*  CREATININE 2.02* 1.88* 1.72* 1.64* 1.48*  CALCIUM 8.0* 8.1* 8.0* 8.4* 8.1*  MG  --   --  2.1 2.1 2.0  PHOS  --   --   --  3.7 3.7    ABG: Recent Labs  Lab 10/03/18 1005  PHART 7.415  PCO2ART 49.7*  PO2ART 134*  HCO3 31.3*  O2SAT 99.3    Liver Function Tests: Recent Labs  Lab 09/29/18 0846 10/02/18 0545  AST 21  --   ALT 11  --   ALKPHOS 128*  --   BILITOT 0.6  --   PROT 5.6*  --   ALBUMIN 1.6* 1.9*   No results for input(s): LIPASE, AMYLASE in the last 168 hours. No results for input(s): AMMONIA in the last 168 hours.  CBC: Recent Labs  Lab 10/01/18 0639 10/02/18 0545  10/04/18 0522  WBC 15.7* 10.7* 10.3  HGB 8.8* 8.8* 8.1*  HCT 27.4* 26.8* 25.8*  MCV 87.5 86.7 87.2  PLT 193 201 178    Cardiac Enzymes: No results for input(s): CKTOTAL, CKMB, CKMBINDEX, TROPONINI in the last 168 hours.  BNP (last 3 results) No results for input(s): BNP in the last 8760 hours.  ProBNP (last 3 results) No results for input(s): PROBNP in the last 8760 hours.  Radiological Exams: Dg Chest Port 1 View  Result Date: 10/03/2018 CLINICAL DATA:  Pleural effusion follow-up EXAM: PORTABLE CHEST 1 VIEW COMPARISON:  Yesterday FINDINGS: Interval tracheal extubation. The nasoenteric tube is in good position. Right upper extremity PICC with tip at the SVC. Layering pleural effusions greater on the right. Vascular congestion and borderline heart size. No pneumothorax. IMPRESSION: Layering pleural effusions, vascular congestion, and presumed atelectasis. No change from yesterday. Electronically Signed   By: Marnee Spring M.D.   On: 10/03/2018 09:26    Assessment/Plan Active Problems:   Acute on chronic respiratory failure with hypoxia (HCC)   Acute on chronic diastolic heart failure (HCC)   Pulmonary hypertension (HCC)   Ascites of liver   MRSA bacteremia   1. Acute on  chronic respiratory failure with hypoxia we will continue with oxygen therapy titrating down slowly 2. Acute on chronic diastolic heart failure patient Chest x-ray showing a layering pleural effusion with some vascular congestion suggest increasing diuretics as tolerated 3. Pulmonary hypertension on oxygen 4. Ascites we will need paracentesis 5. MRSA bacteremia treated we will continue with supportive care   I have personally seen and evaluated the patient, evaluated laboratory and imaging results, formulated the assessment and plan and placed orders. The Patient requires high complexity decision making for assessment and support.  Case was discussed on Rounds with the Respiratory Therapy Staff  Yevonne Pax, MD [facility] Pulmonary Critical Care Medicine Sleep Medicine

## 2018-10-05 ENCOUNTER — Other Ambulatory Visit (HOSPITAL_COMMUNITY): Payer: Medicare Other

## 2018-10-05 DIAGNOSIS — R7881 Bacteremia: Secondary | ICD-10-CM | POA: Diagnosis not present

## 2018-10-05 DIAGNOSIS — R188 Other ascites: Secondary | ICD-10-CM | POA: Diagnosis not present

## 2018-10-05 DIAGNOSIS — J9621 Acute and chronic respiratory failure with hypoxia: Secondary | ICD-10-CM | POA: Diagnosis not present

## 2018-10-05 DIAGNOSIS — I5033 Acute on chronic diastolic (congestive) heart failure: Secondary | ICD-10-CM | POA: Diagnosis not present

## 2018-10-05 MED ORDER — LIDOCAINE HCL (PF) 1 % IJ SOLN
INTRAMUSCULAR | Status: AC
  Filled 2018-10-05: qty 30

## 2018-10-05 NOTE — Procedures (Signed)
PROCEDURE SUMMARY:  Successful US guided therapeutic right thoracentesis. Yielded 1.2 liters of amber fluid. Pt tolerated procedure well. No immediate complications.  Specimen was not sent for labs. CXR ordered.  EBL < 5 mL  Hoyt Koch PA-C 10/05/2018 4:02 PM

## 2018-10-05 NOTE — Progress Notes (Signed)
Pulmonary Critical Care Medicine [facility] [place]   PULMONARY CRITICAL CARE SERVICE  PROGRESS NOTE  Date of Service: 10/05/2018  Frank Levy  PRX:[id number]  DOB: [birth year]   DOA: 09/13/2018  Referring Physician: Carron Curie, MD  HPI: Frank Levy is a 68 y.o. male seen for follow up of Acute on Chronic Respiratory Failure.  Patient is back on BiPAP currently on 35% FiO2 with a pressure of 16/8 chest x-ray had shown some worsening follow-up chest x-ray has been ordered  Medications: Reviewed on Rounds  Physical Exam:  Vitals: Temperature 97.3 pulse 76 respiratory 23 blood pressure 142/75 saturations 97%  Ventilator Settings on BiPAP FiO2 35% pressure 16/8 tidal volume 374  . General: Comfortable at this time . Eyes: Grossly normal lids, irises & conjunctiva . ENT: grossly tongue is normal . Neck: no obvious mass . Cardiovascular: S1 S2 normal no gallop . Respiratory: Scattered rhonchi diminished left side . Abdomen: soft . Skin: no rash seen on limited exam . Musculoskeletal: not rigid . Psychiatric:unable to assess . Neurologic: no seizure no involuntary movements         Lab Data:   Basic Metabolic Panel: Recent Labs  Lab 09/29/18 0846 10/01/18 0639 10/02/18 0545 10/04/18 0522  NA 135 137 138 136  K 4.0 3.8 4.3 3.7  CL 102 99 98 97*  CO2 24 29 31  32  GLUCOSE 142* 179* 151* 140*  BUN 64* 58* 56* 49*  CREATININE 1.88* 1.72* 1.64* 1.48*  CALCIUM 8.1* 8.0* 8.4* 8.1*  MG  --  2.1 2.1 2.0  PHOS  --   --  3.7 3.7    ABG: Recent Labs  Lab 10/03/18 1005  PHART 7.415  PCO2ART 49.7*  PO2ART 134*  HCO3 31.3*  O2SAT 99.3    Liver Function Tests: Recent Labs  Lab 09/29/18 0846 10/02/18 0545  AST 21  --   ALT 11  --   ALKPHOS 128*  --   BILITOT 0.6  --   PROT 5.6*  --   ALBUMIN 1.6* 1.9*   No results for input(s): LIPASE, AMYLASE in the last 168 hours. No results for input(s): AMMONIA in the last 168 hours.  CBC: Recent Labs   Lab 10/01/18 0639 10/02/18 0545 10/04/18 0522  WBC 15.7* 10.7* 10.3  HGB 8.8* 8.8* 8.1*  HCT 27.4* 26.8* 25.8*  MCV 87.5 86.7 87.2  PLT 193 201 178    Cardiac Enzymes: No results for input(s): CKTOTAL, CKMB, CKMBINDEX, TROPONINI in the last 168 hours.  BNP (last 3 results) No results for input(s): BNP in the last 8760 hours.  ProBNP (last 3 results) No results for input(s): PROBNP in the last 8760 hours.  Radiological Exams: No results found.  Assessment/Plan Active Problems:   Acute on chronic respiratory failure with hypoxia (HCC)   Acute on chronic diastolic heart failure (HCC)   Pulmonary hypertension (HCC)   Ascites of liver   MRSA bacteremia   1. Acute on chronic respiratory failure with hypoxia we will continue with BiPAP therapy also follow-up chest x-ray ordered patient is likely going to need a paracentesis and/or thoracentesis because of the acute decline. 2. Acute on chronic diastolic heart failure diuretics as tolerated 3. Pulmonary hypertension on oxygen 4. Ascites as noted above 5. MRSA bacteremia treated we will continue to follow   I have personally seen and evaluated the patient, evaluated laboratory and imaging results, formulated the assessment and plan and placed orders. The Patient requires high complexity decision making  for assessment and support.  Case was discussed on Rounds with the Respiratory Therapy Staff  Allyne Gee, MD [facility] Pulmonary Critical Care Medicine Sleep Medicine

## 2018-10-06 ENCOUNTER — Other Ambulatory Visit (HOSPITAL_COMMUNITY): Payer: Medicare Other

## 2018-10-06 DIAGNOSIS — R188 Other ascites: Secondary | ICD-10-CM | POA: Diagnosis not present

## 2018-10-06 DIAGNOSIS — R7881 Bacteremia: Secondary | ICD-10-CM | POA: Diagnosis not present

## 2018-10-06 DIAGNOSIS — J9621 Acute and chronic respiratory failure with hypoxia: Secondary | ICD-10-CM | POA: Diagnosis not present

## 2018-10-06 DIAGNOSIS — I5033 Acute on chronic diastolic (congestive) heart failure: Secondary | ICD-10-CM | POA: Diagnosis not present

## 2018-10-06 LAB — RENAL FUNCTION PANEL
Albumin: 1.8 g/dL — ABNORMAL LOW (ref 3.5–5.0)
Anion gap: 7 (ref 5–15)
BUN: 46 mg/dL — ABNORMAL HIGH (ref 8–23)
CO2: 34 mmol/L — ABNORMAL HIGH (ref 22–32)
Calcium: 7.9 mg/dL — ABNORMAL LOW (ref 8.9–10.3)
Chloride: 95 mmol/L — ABNORMAL LOW (ref 98–111)
Creatinine, Ser: 1.65 mg/dL — ABNORMAL HIGH (ref 0.61–1.24)
GFR calc Af Amer: 49 mL/min — ABNORMAL LOW (ref >60)
GFR calc non Af Amer: 42 mL/min — ABNORMAL LOW (ref >60)
GLUCOSE: 143 mg/dL — AB (ref 70–99)
Phosphorus: 3.5 mg/dL (ref 2.5–4.6)
Potassium: 3.8 mmol/L (ref 3.5–5.1)
Sodium: 136 mmol/L (ref 135–145)

## 2018-10-06 LAB — CBC
HCT: 26 % — ABNORMAL LOW (ref 39.0–52.0)
Hemoglobin: 8.1 g/dL — ABNORMAL LOW (ref 13.0–17.0)
MCH: 27.4 pg (ref 26.0–34.0)
MCHC: 31.2 g/dL (ref 30.0–36.0)
MCV: 87.8 fL (ref 80.0–100.0)
Platelets: 175 10*3/uL (ref 150–400)
RBC: 2.96 MIL/uL — ABNORMAL LOW (ref 4.22–5.81)
RDW: 15.9 % — ABNORMAL HIGH (ref 11.5–15.5)
WBC: 10.9 10*3/uL — ABNORMAL HIGH (ref 4.0–10.5)
nRBC: 0 % (ref 0.0–0.2)

## 2018-10-06 LAB — AMMONIA: Ammonia: 38 umol/L — ABNORMAL HIGH (ref 9–35)

## 2018-10-06 LAB — MAGNESIUM: Magnesium: 1.9 mg/dL (ref 1.7–2.4)

## 2018-10-06 MED ORDER — LIDOCAINE HCL (PF) 1 % IJ SOLN
INTRAMUSCULAR | Status: AC
  Filled 2018-10-06: qty 30

## 2018-10-06 NOTE — Progress Notes (Signed)
   Limited US Abd performed bedside  Very small amount of ascites noted   Around liver No safe window to access  Para was ordered therapeutic only-- no labs-- not diagnostic  NO Para performed

## 2018-10-06 NOTE — Progress Notes (Addendum)
Pulmonary Critical Care Medicine [facility] [place]   PULMONARY CRITICAL CARE SERVICE  PROGRESS NOTE  Date of Service: 10/06/2018  Frank Levy  [id number]  DOB: 02-03-1951   DOA: 09/13/2018  Referring Physician: Carron Curie, MD  HPI: Frank Levy is a 68 y.o. male seen for follow up of Acute on Chronic Respiratory Failure.  Patient remains on 2 L nasal cannula and doing well.  He wore his BiPAP last night without any difficulty and seems to be doing well with that.  Medications: Reviewed on Rounds  Physical Exam:  Vitals: Pulse 68 respirations 20 BP 122/59 O2 sat 100% temp 97.5  Ventilator Settings patient's not currently on ventilator  . General: Comfortable at this time . Eyes: Grossly normal lids, irises & conjunctiva . ENT: grossly tongue is normal . Neck: no obvious mass . Cardiovascular: S1 S2 normal no gallop . Respiratory: No rales or rhonchi . Abdomen: soft . Skin: no rash seen on limited exam . Musculoskeletal: not rigid . Psychiatric:unable to assess . Neurologic: no seizure no involuntary movements         Lab Data:   Basic Metabolic Panel: Recent Labs  Lab 10/01/18 0639 10/02/18 0545 10/04/18 0522 10/06/18 0453  NA 137 138 136 136  K 3.8 4.3 3.7 3.8  CL 99 98 97* 95*  CO2 29 31 32 34*  GLUCOSE 179* 151* 140* 143*  BUN 58* 56* 49* 46*  CREATININE 1.72* 1.64* 1.48* 1.65*  CALCIUM 8.0* 8.4* 8.1* 7.9*  MG 2.1 2.1 2.0 1.9  PHOS  --  3.7 3.7 3.5    ABG: Recent Labs  Lab 10/03/18 1005  PHART 7.415  PCO2ART 49.7*  PO2ART 134*  HCO3 31.3*  O2SAT 99.3    Liver Function Tests: Recent Labs  Lab 10/02/18 0545 10/06/18 0453  ALBUMIN 1.9* 1.8*   No results for input(s): LIPASE, AMYLASE in the last 168 hours. Recent Labs  Lab 10/06/18 0453  AMMONIA 38*    CBC: Recent Labs  Lab 10/01/18 0639 10/02/18 0545 10/04/18 0522 10/06/18 0453  WBC 15.7* 10.7* 10.3 10.9*  HGB 8.8* 8.8* 8.1* 8.1*  HCT 27.4* 26.8* 25.8*  26.0*  MCV 87.5 86.7 87.2 87.8  PLT 193 201 178 175    Cardiac Enzymes: No results for input(s): CKTOTAL, CKMB, CKMBINDEX, TROPONINI in the last 168 hours.  BNP (last 3 results) No results for input(s): BNP in the last 8760 hours.  ProBNP (last 3 results) No results for input(s): PROBNP in the last 8760 hours.  Radiological Exams: Dg Chest Port 1 View  Result Date: 10/05/2018 CLINICAL DATA:  Post RIGHT thoracentesis EXAM: PORTABLE CHEST 1 VIEW COMPARISON:  Portable exam 1649 hours compared to 1150 hours FINDINGS: Decrease in RIGHT pleural effusion and basilar atelectasis post thoracentesis. No pneumothorax. Persistent LEFT basilar infiltrate and pleural effusion. Stable heart size. IMPRESSION: Decreased RIGHT pleural effusion and basilar atelectasis post thoracentesis without evidence of pneumothorax. Remainder of exam unchanged. Electronically Signed   By: Ulyses Southward M.D.   On: 10/05/2018 17:19   Dg Chest Port 1 View  Result Date: 10/05/2018 CLINICAL DATA:  Pneumonia. EXAM: PORTABLE CHEST 1 VIEW COMPARISON:  Radiograph of October 03, 2018. FINDINGS: Stable cardiomegaly. Atherosclerosis of thoracic aorta is noted. Nasogastric tube is unchanged in position. Right-sided PICC line is unchanged. No pneumothorax is noted. Bilateral pleural effusions are noted, right greater than left, with associated atelectasis. Bony thorax is unremarkable. IMPRESSION: Bilateral pleural effusions are noted, right greater than left, with associated  atelectasis. Aortic Atherosclerosis (ICD10-I70.0). Electronically Signed   By: Lupita Raider, M.D.   On: 10/05/2018 12:36   [place] Ascites (abdomen Limited)  Result Date: 10/06/2018 CLINICAL DATA:  Request for therapeutic paracentesis EXAM: LIMITED ABDOMEN ULTRASOUND FOR ASCITES TECHNIQUE: Limited ultrasound survey for ascites was performed in all four abdominal quadrants. COMPARISON:  None. FINDINGS: There is a small amount of ascites in the right and left lower  quadrants. Paracentesis was not performed. IMPRESSION: Small amount of ascites.  Paracentesis was not performed. Electronically Signed   By: Jolaine Click M.D.   On: 10/06/2018 09:51   US Thoracentesis Asp Pleural Space W/img Guide  Result Date: 10/05/2018 INDICATION: Patient with history of respiratory failure, bilateral pleural effusions. Request is made for therapeutic right thoracentesis. EXAM: ULTRASOUND GUIDED THERAPEUTIC RIGHT THORACENTESIS MEDICATIONS: 10 mL 1% lidocaine COMPLICATIONS: None immediate. PROCEDURE: An ultrasound guided thoracentesis was thoroughly discussed with the patient and questions answered. The benefits, risks, alternatives and complications were also discussed. The patient understands and wishes to proceed with the procedure. Written consent was obtained. Ultrasound was performed to localize and mark an adequate pocket of fluid in the right chest. The area was then prepped and draped in the normal sterile fashion. 1% Lidocaine was used for local anesthesia. Under ultrasound guidance a 6 Fr Safe-T-Centesis catheter was introduced. Thoracentesis was performed. The catheter was removed and a dressing applied. FINDINGS: A total of approximately 1.2 liters of amber fluid was removed. IMPRESSION: Successful ultrasound guided right thoracentesis yielding 1.2 liters of pleural fluid. Read by: Loyce Dys PA-C Electronically Signed   By: Corlis Leak M.D.   On: 10/05/2018 16:32    Assessment/Plan Active Problems:   Acute on chronic respiratory failure with hypoxia (HCC)   Acute on chronic diastolic heart failure (HCC)   Pulmonary hypertension (HCC)   Ascites of liver   MRSA bacteremia   1. Acute on chronic respiratory failure with hypoxia continue with BiPAP therapy at night as ordered.  Continue to follow chest x-rays as patient will likely need a paracentesis and/or thoracentesis. 2. Acute on chronic diastolic heart failure diuretics as tolerated 3. Pulmonary hypertension on  oxygen 4. Ascites as noted above 5. MRSA bacteremia treated continue to follow   I have personally seen and evaluated the patient, evaluated laboratory and imaging results, formulated the assessment and plan and placed orders. The Patient requires high complexity decision making for assessment and support.  Case was discussed on Rounds with the Respiratory Therapy Staff  Yevonne Pax, MD [facility] Pulmonary Critical Care Medicine Sleep Medicine

## 2018-10-07 DIAGNOSIS — I5033 Acute on chronic diastolic (congestive) heart failure: Secondary | ICD-10-CM | POA: Diagnosis not present

## 2018-10-07 DIAGNOSIS — R188 Other ascites: Secondary | ICD-10-CM | POA: Diagnosis not present

## 2018-10-07 DIAGNOSIS — J9621 Acute and chronic respiratory failure with hypoxia: Secondary | ICD-10-CM | POA: Diagnosis not present

## 2018-10-07 DIAGNOSIS — R7881 Bacteremia: Secondary | ICD-10-CM | POA: Diagnosis not present

## 2018-10-07 NOTE — Progress Notes (Addendum)
Pulmonary Critical Care Medicine [facility] [place]   PULMONARY CRITICAL CARE SERVICE  PROGRESS NOTE  Date of Service: 10/07/2018  Frank Levy  [id number]  DOB: October 31, 1950   DOA: 09/13/2018  Referring Physician: Carron Curie, MD  HPI: Frank Levy is a 68 y.o. male seen for follow up of Acute on Chronic Respiratory Failure.  Patient did well again last night using BiPAP.  He remains on 2 L of oxygen via nasal cannula during the day and is doing well.  Medications: Reviewed on Rounds  Physical Exam:  Vitals: Pulse 68 respirations 23 BP 128/72 O2 sat 99% temp 97.8  Ventilator Settings patient's not currently on ventilator  . General: Comfortable at this time . Eyes: Grossly normal lids, irises & conjunctiva . ENT: grossly tongue is normal . Neck: no obvious mass . Cardiovascular: S1 S2 normal no gallop . Respiratory: Coarse breath sounds . Abdomen: soft . Skin: no rash seen on limited exam . Musculoskeletal: not rigid . Psychiatric:unable to assess . Neurologic: no seizure no involuntary movements         Lab Data:   Basic Metabolic Panel: Recent Labs  Lab 10/01/18 0639 10/02/18 0545 10/04/18 0522 10/06/18 0453  NA 137 138 136 136  K 3.8 4.3 3.7 3.8  CL 99 98 97* 95*  CO2 29 31 32 34*  GLUCOSE 179* 151* 140* 143*  BUN 58* 56* 49* 46*  CREATININE 1.72* 1.64* 1.48* 1.65*  CALCIUM 8.0* 8.4* 8.1* 7.9*  MG 2.1 2.1 2.0 1.9  PHOS  --  3.7 3.7 3.5    ABG: Recent Labs  Lab 10/03/18 1005  PHART 7.415  PCO2ART 49.7*  PO2ART 134*  HCO3 31.3*  O2SAT 99.3    Liver Function Tests: Recent Labs  Lab 10/02/18 0545 10/06/18 0453  ALBUMIN 1.9* 1.8*   No results for input(s): LIPASE, AMYLASE in the last 168 hours. Recent Labs  Lab 10/06/18 0453  AMMONIA 38*    CBC: Recent Labs  Lab 10/01/18 0639 10/02/18 0545 10/04/18 0522 10/06/18 0453  WBC 15.7* 10.7* 10.3 10.9*  HGB 8.8* 8.8* 8.1* 8.1*  HCT 27.4* 26.8* 25.8* 26.0*  MCV 87.5  86.7 87.2 87.8  PLT 193 201 178 175    Cardiac Enzymes: No results for input(s): CKTOTAL, CKMB, CKMBINDEX, TROPONINI in the last 168 hours.  BNP (last 3 results) No results for input(s): BNP in the last 8760 hours.  ProBNP (last 3 results) No results for input(s): PROBNP in the last 8760 hours.  Radiological Exams: Dg Chest Port 1 View  Result Date: 10/05/2018 CLINICAL DATA:  Post RIGHT thoracentesis EXAM: PORTABLE CHEST 1 VIEW COMPARISON:  Portable exam 1649 hours compared to 1150 hours FINDINGS: Decrease in RIGHT pleural effusion and basilar atelectasis post thoracentesis. No pneumothorax. Persistent LEFT basilar infiltrate and pleural effusion. Stable heart size. IMPRESSION: Decreased RIGHT pleural effusion and basilar atelectasis post thoracentesis without evidence of pneumothorax. Remainder of exam unchanged. Electronically Signed   By: Ulyses Southward M.D.   On: 10/05/2018 17:19   [place] Ascites (abdomen Limited)  Result Date: 10/06/2018 CLINICAL DATA:  Request for therapeutic paracentesis EXAM: LIMITED ABDOMEN ULTRASOUND FOR ASCITES TECHNIQUE: Limited ultrasound survey for ascites was performed in all four abdominal quadrants. COMPARISON:  None. FINDINGS: There is a small amount of ascites in the right and left lower quadrants. Paracentesis was not performed. IMPRESSION: Small amount of ascites.  Paracentesis was not performed. Electronically Signed   By: Jolaine Click M.D.   On: 10/06/2018 09:51  US Thoracentesis Asp Pleural Space W/img Guide  Result Date: 10/05/2018 INDICATION: Patient with history of respiratory failure, bilateral pleural effusions. Request is made for therapeutic right thoracentesis. EXAM: ULTRASOUND GUIDED THERAPEUTIC RIGHT THORACENTESIS MEDICATIONS: 10 mL 1% lidocaine COMPLICATIONS: None immediate. PROCEDURE: An ultrasound guided thoracentesis was thoroughly discussed with the patient and questions answered. The benefits, risks, alternatives and complications were also  discussed. The patient understands and wishes to proceed with the procedure. Written consent was obtained. Ultrasound was performed to localize and mark an adequate pocket of fluid in the right chest. The area was then prepped and draped in the normal sterile fashion. 1% Lidocaine was used for local anesthesia. Under ultrasound guidance a 6 Fr Safe-T-Centesis catheter was introduced. Thoracentesis was performed. The catheter was removed and a dressing applied. FINDINGS: A total of approximately 1.2 liters of amber fluid was removed. IMPRESSION: Successful ultrasound guided right thoracentesis yielding 1.2 liters of pleural fluid. Read by: Loyce Dys PA-C Electronically Signed   By: Corlis Leak M.D.   On: 10/05/2018 16:32    Assessment/Plan Active Problems:   Acute on chronic respiratory failure with hypoxia (HCC)   Acute on chronic diastolic heart failure (HCC)   Pulmonary hypertension (HCC)   Ascites of liver   MRSA bacteremia   1. Acute on chronic respiratory failure with hypoxia continue with BiPAP therapy at night as ordered.  We will continue to follow chest x-rays for the possibility of a repeat paracentesis/thoracentesis. 2. Acute on chronic diastolic heart failure diuretics as tolerated 3. Pulmonary hypertension on oxygen 4. Ascites as noted above 5. MRSA bacteremia treated continue to follow   I have personally seen and evaluated the patient, evaluated laboratory and imaging results, formulated the assessment and plan and placed orders. The Patient requires high complexity decision making for assessment and support.  Case was discussed on Rounds with the Respiratory Therapy Staff  Yevonne Pax, MD [facility] Pulmonary Critical Care Medicine Sleep Medicine

## 2018-10-08 ENCOUNTER — Other Ambulatory Visit (HOSPITAL_COMMUNITY): Payer: Medicare Other

## 2018-10-08 DIAGNOSIS — R188 Other ascites: Secondary | ICD-10-CM | POA: Diagnosis not present

## 2018-10-08 DIAGNOSIS — R7881 Bacteremia: Secondary | ICD-10-CM | POA: Diagnosis not present

## 2018-10-08 DIAGNOSIS — I5033 Acute on chronic diastolic (congestive) heart failure: Secondary | ICD-10-CM | POA: Diagnosis not present

## 2018-10-08 DIAGNOSIS — J9621 Acute and chronic respiratory failure with hypoxia: Secondary | ICD-10-CM | POA: Diagnosis not present

## 2018-10-08 LAB — RENAL FUNCTION PANEL
ANION GAP: 11 (ref 5–15)
Albumin: 1.7 g/dL — ABNORMAL LOW (ref 3.5–5.0)
BUN: 51 mg/dL — ABNORMAL HIGH (ref 8–23)
CO2: 29 mmol/L (ref 22–32)
Calcium: 7.9 mg/dL — ABNORMAL LOW (ref 8.9–10.3)
Chloride: 89 mmol/L — ABNORMAL LOW (ref 98–111)
Creatinine, Ser: 1.91 mg/dL — ABNORMAL HIGH (ref 0.61–1.24)
GFR calc non Af Amer: 35 mL/min — ABNORMAL LOW (ref >60)
GFR, EST AFRICAN AMERICAN: 41 mL/min — AB (ref >60)
Glucose, Bld: 178 mg/dL — ABNORMAL HIGH (ref 70–99)
Phosphorus: 3.4 mg/dL (ref 2.5–4.6)
Potassium: 3.5 mmol/L (ref 3.5–5.1)
Sodium: 129 mmol/L — ABNORMAL LOW (ref 135–145)

## 2018-10-08 LAB — SEDIMENTATION RATE: Sed Rate: 65 mm/hr — ABNORMAL HIGH (ref 0–16)

## 2018-10-08 LAB — CBC
HCT: 24.5 % — ABNORMAL LOW (ref 39.0–52.0)
Hemoglobin: 7.9 g/dL — ABNORMAL LOW (ref 13.0–17.0)
MCH: 28.1 pg (ref 26.0–34.0)
MCHC: 32.2 g/dL (ref 30.0–36.0)
MCV: 87.2 fL (ref 80.0–100.0)
Platelets: 173 10*3/uL (ref 150–400)
RBC: 2.81 MIL/uL — ABNORMAL LOW (ref 4.22–5.81)
RDW: 15.9 % — ABNORMAL HIGH (ref 11.5–15.5)
WBC: 15.3 10*3/uL — ABNORMAL HIGH (ref 4.0–10.5)
nRBC: 0 % (ref 0.0–0.2)

## 2018-10-08 LAB — MAGNESIUM: Magnesium: 1.8 mg/dL (ref 1.7–2.4)

## 2018-10-08 LAB — C-REACTIVE PROTEIN: CRP: 8.6 mg/dL — ABNORMAL HIGH (ref <1.0)

## 2018-10-08 NOTE — Progress Notes (Signed)
Pulmonary Critical Care Medicine [facility] [place]   PULMONARY CRITICAL CARE SERVICE  PROGRESS NOTE  Date of Service: 10/08/2018  Frank Levy  [id number]  DOB: 03/10/1951   DOA: 09/13/2018  Referring Physician: Carron Curie, MD  HPI: Frank Levy is a 68 y.o. male seen for follow up of Acute on Chronic Respiratory Failure.  Radiologically patient's chest x-ray looks a lot worse.  He is back on BiPAP currently on 35% FiO2 overnight and then now on 2 L nasal cannula during the daytime currently is on a pressure of 16/8  Medications: Reviewed on Rounds  Physical Exam:  Vitals: Temperature 98.0 pulse 74 respiratory 22 blood pressure 127/65 saturations 97%  Ventilator Settings BiPAP FiO2 35% pressure support 16 PEEP 8 tidal volume 325  . General: Comfortable at this time . Eyes: Grossly normal lids, irises & conjunctiva . ENT: grossly tongue is normal . Neck: no obvious mass . Cardiovascular: S1 S2 normal no gallop . Respiratory: Scattered rhonchi expansion is equal . Abdomen: soft . Skin: no rash seen on limited exam . Musculoskeletal: not rigid . Psychiatric:unable to assess . Neurologic: no seizure no involuntary movements         Lab Data:   Basic Metabolic Panel: Recent Labs  Lab 10/02/18 0545 10/04/18 0522 10/06/18 0453 10/08/18 0601  NA 138 136 136 129*  K 4.3 3.7 3.8 3.5  CL 98 97* 95* 89*  CO2 31 32 34* 29  GLUCOSE 151* 140* 143* 178*  BUN 56* 49* 46* 51*  CREATININE 1.64* 1.48* 1.65* 1.91*  CALCIUM 8.4* 8.1* 7.9* 7.9*  MG 2.1 2.0 1.9 1.8  PHOS 3.7 3.7 3.5 3.4    ABG: Recent Labs  Lab 10/03/18 1005  PHART 7.415  PCO2ART 49.7*  PO2ART 134*  HCO3 31.3*  O2SAT 99.3    Liver Function Tests: Recent Labs  Lab 10/02/18 0545 10/06/18 0453 10/08/18 0601  ALBUMIN 1.9* 1.8* 1.7*   No results for input(s): LIPASE, AMYLASE in the last 168 hours. Recent Labs  Lab 10/06/18 0453  AMMONIA 38*    CBC: Recent Labs  Lab  10/02/18 0545 10/04/18 0522 10/06/18 0453 10/08/18 0601  WBC 10.7* 10.3 10.9* 15.3*  HGB 8.8* 8.1* 8.1* 7.9*  HCT 26.8* 25.8* 26.0* 24.5*  MCV 86.7 87.2 87.8 87.2  PLT 201 178 175 173    Cardiac Enzymes: No results for input(s): CKTOTAL, CKMB, CKMBINDEX, TROPONINI in the last 168 hours.  BNP (last 3 results) No results for input(s): BNP in the last 8760 hours.  ProBNP (last 3 results) No results for input(s): PROBNP in the last 8760 hours.  Radiological Exams: Dg Chest Port 1 View  Result Date: 10/08/2018 CLINICAL DATA:  Follow-up pleural effusion.  Respiratory failure. EXAM: PORTABLE CHEST 1 VIEW COMPARISON:  October 05, 2018 FINDINGS: The right PICC line terminates in the central SVC. The NG tube terminates below today's film. No pneumothorax. Stable cardiomediastinal silhouette. Bilateral pleural effusions with underlying atelectasis likely remain. Diffuse bilateral pulmonary opacities are significantly worsened in the interval. IMPRESSION: 1. Bilateral pleural effusions with underlying opacities likely remain. 2. Diffuse bilateral pulmonary opacities. The lungs are significantly worsened and may represent edema or a developing diffuse infectious process. Recommend clinical correlation and follow-up to resolution. Electronically Signed   By: Gerome Sam III M.D   On: 10/08/2018 08:26    Assessment/Plan Active Problems:   Acute on chronic respiratory failure with hypoxia (HCC)   Acute on chronic diastolic heart failure (HCC)  Pulmonary hypertension (HCC)   Ascites of liver   MRSA bacteremia   1. Acute on chronic respiratory failure with hypoxia we will continue with BiPAP for now.  Chest x-ray looks significantly worse.  Discussed with the primary care team he is either going to need to have goals of care meeting or he will probably end up needing to be reintubated.  The x-ray looks more like fluid at this point 2. Acute on chronic diastolic heart failure continue with  diuretics but limited by the renal function.  We will continue with supportive care monitor fluid status 3. Pulmonary hypertension continue with present management oxygen therapy 4. Ascites follow-up did not show much in the way of fluids 5. MRSA bacteremia has been treated   I have personally seen and evaluated the patient, evaluated laboratory and imaging results, formulated the assessment and plan and placed orders. The Patient requires high complexity decision making for assessment and support.  Case was discussed on Rounds with the Respiratory Therapy Staff  Yevonne Pax, MD [facility] Pulmonary Critical Care Medicine Sleep Medicine

## 2018-10-09 ENCOUNTER — Other Ambulatory Visit (HOSPITAL_COMMUNITY): Payer: Medicare Other

## 2018-10-09 DIAGNOSIS — R7881 Bacteremia: Secondary | ICD-10-CM | POA: Diagnosis not present

## 2018-10-09 DIAGNOSIS — I5033 Acute on chronic diastolic (congestive) heart failure: Secondary | ICD-10-CM | POA: Diagnosis not present

## 2018-10-09 DIAGNOSIS — J9621 Acute and chronic respiratory failure with hypoxia: Secondary | ICD-10-CM | POA: Diagnosis not present

## 2018-10-09 DIAGNOSIS — R188 Other ascites: Secondary | ICD-10-CM | POA: Diagnosis not present

## 2018-10-09 LAB — BASIC METABOLIC PANEL
Anion gap: 12 (ref 5–15)
BUN: 56 mg/dL — AB (ref 8–23)
CO2: 26 mmol/L (ref 22–32)
Calcium: 7.8 mg/dL — ABNORMAL LOW (ref 8.9–10.3)
Chloride: 89 mmol/L — ABNORMAL LOW (ref 98–111)
Creatinine, Ser: 2.07 mg/dL — ABNORMAL HIGH (ref 0.61–1.24)
GFR calc Af Amer: 37 mL/min — ABNORMAL LOW (ref >60)
GFR calc non Af Amer: 32 mL/min — ABNORMAL LOW (ref >60)
Glucose, Bld: 117 mg/dL — ABNORMAL HIGH (ref 70–99)
POTASSIUM: 4 mmol/L (ref 3.5–5.1)
Sodium: 127 mmol/L — ABNORMAL LOW (ref 135–145)

## 2018-10-09 MED ORDER — LIDOCAINE HCL (PF) 1 % IJ SOLN
INTRAMUSCULAR | Status: AC
  Filled 2018-10-09: qty 30

## 2018-10-09 NOTE — Procedures (Signed)
PROCEDURE SUMMARY:  Successful US guided paracentesis from right lateral abdomen.  Yielded 4.4 liters of yellow fluid.  No immediate complications.  Pt tolerated well.   Specimen was not sent for labs.  EBL < 69mL  Hoyt Koch PA-C 10/09/2018 4:04 PM

## 2018-10-09 NOTE — Progress Notes (Signed)
Pulmonary Critical Care Medicine [facility] [place]   PULMONARY CRITICAL CARE SERVICE  PROGRESS NOTE  Date of Service: 10/09/2018  Frank Levy  QQV:[id number]  DOB: [birth year]   DOA: 09/13/2018  Referring Physician: Carron Curie, MD  HPI: Frank Levy is a 68 y.o. male seen for follow up of Acute on Chronic Respiratory Failure.  Currently on 2 L using BiPAP at nighttime looks a little bit better today  Medications: Reviewed on Rounds  Physical Exam:  Vitals: Temperature 97.6 pulse 76 respiratory 20 blood pressure 124/63 saturations 98%  Ventilator Settings off the ventilator on 2 L right now  . General: Comfortable at this time . Eyes: Grossly normal lids, irises & conjunctiva . ENT: grossly tongue is normal . Neck: no obvious mass . Cardiovascular: S1 S2 normal no gallop . Respiratory: No rhonchi or rales are noted . Abdomen: soft . Skin: no rash seen on limited exam . Musculoskeletal: not rigid . Psychiatric:unable to assess . Neurologic: no seizure no involuntary movements         Lab Data:   Basic Metabolic Panel: Recent Labs  Lab 10/04/18 0522 10/06/18 0453 10/08/18 0601 10/09/18 0547  NA 136 136 129* 127*  K 3.7 3.8 3.5 4.0  CL 97* 95* 89* 89*  CO2 32 34* 29 26  GLUCOSE 140* 143* 178* 117*  BUN 49* 46* 51* 56*  CREATININE 1.48* 1.65* 1.91* 2.07*  CALCIUM 8.1* 7.9* 7.9* 7.8*  MG 2.0 1.9 1.8  --   PHOS 3.7 3.5 3.4  --     ABG: Recent Labs  Lab 10/03/18 1005  PHART 7.415  PCO2ART 49.7*  PO2ART 134*  HCO3 31.3*  O2SAT 99.3    Liver Function Tests: Recent Labs  Lab 10/06/18 0453 10/08/18 0601  ALBUMIN 1.8* 1.7*   No results for input(s): LIPASE, AMYLASE in the last 168 hours. Recent Labs  Lab 10/06/18 0453  AMMONIA 38*    CBC: Recent Labs  Lab 10/04/18 0522 10/06/18 0453 10/08/18 0601  WBC 10.3 10.9* 15.3*  HGB 8.1* 8.1* 7.9*  HCT 25.8* 26.0* 24.5*  MCV 87.2 87.8 87.2  PLT 178 175 173    Cardiac  Enzymes: No results for input(s): CKTOTAL, CKMB, CKMBINDEX, TROPONINI in the last 168 hours.  BNP (last 3 results) No results for input(s): BNP in the last 8760 hours.  ProBNP (last 3 results) No results for input(s): PROBNP in the last 8760 hours.  Radiological Exams: Dg Chest Port 1 View  Result Date: 10/08/2018 CLINICAL DATA:  Follow-up pleural effusion.  Respiratory failure. EXAM: PORTABLE CHEST 1 VIEW COMPARISON:  October 05, 2018 FINDINGS: The right PICC line terminates in the central SVC. The NG tube terminates below today's film. No pneumothorax. Stable cardiomediastinal silhouette. Bilateral pleural effusions with underlying atelectasis likely remain. Diffuse bilateral pulmonary opacities are significantly worsened in the interval. IMPRESSION: 1. Bilateral pleural effusions with underlying opacities likely remain. 2. Diffuse bilateral pulmonary opacities. The lungs are significantly worsened and may represent edema or a developing diffuse infectious process. Recommend clinical correlation and follow-up to resolution. Electronically Signed   By: Gerome Sam III M.D   On: 10/08/2018 08:26    Assessment/Plan Active Problems:   Acute on chronic respiratory failure with hypoxia (HCC)   Acute on chronic diastolic heart failure (HCC)   Pulmonary hypertension (HCC)   Ascites of liver   MRSA bacteremia   1. Acute on chronic respiratory failure with hypoxia we will continue with 2 L titrate oxygen continue  secretion management pulmonary toilet 2. Acute on chronic diastolic heart failure at baseline diuretics as tolerated there is some effusions noted bilaterally concerning for fluid overload 3. Pulmonary hypertension on oxygen therapy 4. Ascites needs recurrent follow-up with primary care team 5. MRSA bacteremia treated improving   I have personally seen and evaluated the patient, evaluated laboratory and imaging results, formulated the assessment and plan and placed orders. The  Patient requires high complexity decision making for assessment and support.  Case was discussed on Rounds with the Respiratory Therapy Staff  Yevonne Pax, MD [facility] Pulmonary Critical Care Medicine Sleep Medicine

## 2018-10-10 DIAGNOSIS — R188 Other ascites: Secondary | ICD-10-CM | POA: Diagnosis not present

## 2018-10-10 DIAGNOSIS — I5033 Acute on chronic diastolic (congestive) heart failure: Secondary | ICD-10-CM | POA: Diagnosis not present

## 2018-10-10 DIAGNOSIS — J9621 Acute and chronic respiratory failure with hypoxia: Secondary | ICD-10-CM | POA: Diagnosis not present

## 2018-10-10 DIAGNOSIS — R7881 Bacteremia: Secondary | ICD-10-CM | POA: Diagnosis not present

## 2018-10-10 LAB — CBC
HCT: 22.5 % — ABNORMAL LOW (ref 39.0–52.0)
Hemoglobin: 7.3 g/dL — ABNORMAL LOW (ref 13.0–17.0)
MCH: 28.5 pg (ref 26.0–34.0)
MCHC: 32.4 g/dL (ref 30.0–36.0)
MCV: 87.9 fL (ref 80.0–100.0)
Platelets: 149 10*3/uL — ABNORMAL LOW (ref 150–400)
RBC: 2.56 MIL/uL — ABNORMAL LOW (ref 4.22–5.81)
RDW: 16.1 % — ABNORMAL HIGH (ref 11.5–15.5)
WBC: 12 10*3/uL — ABNORMAL HIGH (ref 4.0–10.5)
nRBC: 0 % (ref 0.0–0.2)

## 2018-10-10 LAB — RENAL FUNCTION PANEL
Albumin: 2 g/dL — ABNORMAL LOW (ref 3.5–5.0)
Anion gap: 8 (ref 5–15)
BUN: 57 mg/dL — ABNORMAL HIGH (ref 8–23)
CO2: 30 mmol/L (ref 22–32)
Calcium: 7.6 mg/dL — ABNORMAL LOW (ref 8.9–10.3)
Chloride: 90 mmol/L — ABNORMAL LOW (ref 98–111)
Creatinine, Ser: 2.12 mg/dL — ABNORMAL HIGH (ref 0.61–1.24)
GFR calc Af Amer: 36 mL/min — ABNORMAL LOW (ref >60)
GFR calc non Af Amer: 31 mL/min — ABNORMAL LOW (ref >60)
GLUCOSE: 129 mg/dL — AB (ref 70–99)
Phosphorus: 3.8 mg/dL (ref 2.5–4.6)
Potassium: 3.9 mmol/L (ref 3.5–5.1)
Sodium: 128 mmol/L — ABNORMAL LOW (ref 135–145)

## 2018-10-10 NOTE — Consult Note (Signed)
[facility] CONSULT NOTE    Date: 10/10/2018                  Patient Name:  Frank Levy  MRN: [id number]  DOB: 1950/09/26  Age / Sex: 68 y.o., male         PCP: System, Pcp Not In                 Service Requesting Consult: Hospitalist                 Reason for Consult: Acute renal failure            History of Present Illness: Patient is a 69 y.o. 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  We are asked to see the patient for evaluation management of acute renal failure.  The patient's peak creatinine this admission has been 2.89.  Creatinine has been as low as 1.48 on 10/04/2018.  However BUN now up to 57 with a creatinine of 2.12.  Hyponatremia has also developed and serum sodium currently 128.  Patient has been taken off of diuretics and is being provided albumin at the moment.  Renal ultrasound was checked and negative for hydronephrosis.   Medications: Current medications: Ciprofloxacin 400 mg IV every 12 hours, sliding scale insulin with Humalog, hydralazine 30 mg every 6 hours, amlodipine 10 mg daily, citalopram 10 mg daily, folic acid 1 mg daily, guaifenesin 200 mg every 6 hours, heparin 5000 units subcu every 8 hours, magnesium oxide 4 mg twice daily, MiraLAX 17 g daily, multivitamin 1 tablet daily, pantoprazole 40 mg twice daily, pro-stat 30 cc twice daily, Flomax 0.4 mg daily, thiamine 100 mg daily, Zyvox 600 mg twice daily  Allergies: No known drug allergies   Past Medical History: Past Medical History:  Diagnosis Date  . Acute on chronic diastolic heart failure (HCC)   . Acute on chronic respiratory failure with hypoxia (HCC)   . Ascites of liver   . MRSA bacteremia   .  Pulmonary hypertension (HCC)      Past Surgical History: Unable to obtain from the patient as he is currently on BiPAP.  Family History: Unable to obtain from the patient as he is currently on BiPAP.  Social History: Unable to obtain from the patient as he is currently on BiPAP.  Review of Systems: Unable to obtain from the patient as he is currently on BiPAP.  Vital Signs: Temperature 97.6 pulse 75 respirations 22 blood pressure 119/62 Weight trends: There were no vitals filed for this visit.  Physical Exam: General:  Critically ill-appearing  Head: Normocephalic, atraumatic.  Eyes: Anicteric  Nose: Mucous membranes moist, not inflammed, nonerythematous.  Throat: Bipap facemask cannot, could no visualize oropharynx  Neck: Supple, trachea midline.  Lungs:  Bilateral rhonchi and rales  Heart: RRR. S1 and S2 normal without gallop, murmur, or rubs.  Abdomen:  BS normoactive. Soft, Nondistended, non-tender.  No masses or organomegaly.  Extremities: 3+ LE edema  Neurologic: Arousable not following commands  Skin: Increased turgor due to generalized edema    Lab results: Basic Metabolic Panel: Recent Labs  Lab 10/04/18 0522 10/06/18 0453 10/08/18 0601 10/09/18 0547 10/10/18 0546  NA 136 136 129* 127* 128*  K 3.7 3.8 3.5 4.0  3.9  CL 97* 95* 89* 89* 90*  CO2 32 34* 29 26 30   GLUCOSE 140* 143* 178* 117* 129*  BUN 49* 46* 51* 56* 57*  CREATININE 1.48* 1.65* 1.91* 2.07* 2.12*  CALCIUM 8.1* 7.9* 7.9* 7.8* 7.6*  MG 2.0 1.9 1.8  --   --   PHOS 3.7 3.5 3.4  --  3.8    Liver Function Tests: Recent Labs  Lab 10/06/18 0453 10/08/18 0601 10/10/18 0546  ALBUMIN 1.8* 1.7* 2.0*   No results for input(s): LIPASE, AMYLASE in the last 168 hours. Recent Labs  Lab 10/06/18 0453  AMMONIA 38*    CBC: Recent Labs  Lab 10/04/18 0522 10/06/18 0453 10/08/18 0601 10/10/18 0546  WBC 10.3 10.9* 15.3* 12.0*  HGB 8.1* 8.1* 7.9* 7.3*  HCT 25.8* 26.0* 24.5* 22.5*  MCV 87.2  87.8 87.2 87.9  PLT 178 175 173 149*    Cardiac Enzymes: No results for input(s): CKTOTAL, CKMB, CKMBINDEX, TROPONINI in the last 168 hours.  BNP: Invalid input(s): POCBNP  CBG: No results for input(s): GLUCAP in the last 168 hours.  Microbiology: Results for orders placed or performed during the hospital encounter of 09/13/18  Culture, blood (routine x 2)     Status: None   Collection Time: 09/25/18  3:08 PM  Result Value Ref Range Status   Specimen Description BLOOD RIGHT HAND  Final   Special Requests   Final    BOTTLES DRAWN AEROBIC ONLY Blood Culture results may not be optimal due to an inadequate volume of blood received in culture bottles   Culture   Final    NO GROWTH 5 DAYS Performed at [facility], [address] [place], [place] [address]    Report Status 09/30/2018 FINAL  Final  Culture, blood (routine x 2)     Status: None   Collection Time: 09/25/18  3:10 PM  Result Value Ref Range Status   Specimen Description BLOOD RIGHT ARM  Final   Special Requests   Final    BOTTLES DRAWN AEROBIC ONLY Blood Culture results may not be optimal due to an inadequate volume of blood received in culture bottles   Culture   Final    NO GROWTH 5 DAYS Performed at [facility], [address], [place], [place] [address]    Report Status 09/30/2018 FINAL  Final  Culture, respiratory     Status: None   Collection Time: 10/01/18  3:00 PM  Result Value Ref Range Status   Specimen Description TRACHEAL ASPIRATE  Final   Special Requests NONE  Final   Gram Stain   Final    MODERATE WBC PRESENT, PREDOMINANTLY PMN NO SQUAMOUS EPITHELIAL CELLS PRESENT FEW GRAM NEGATIVE RODS FEW GRAM POSITIVE RODS FEW GRAM POSITIVE COCCI IN PAIRS Performed at [facility], [address], [place], [place] [address]    Culture MODERATE PSEUDOMONAS AERUGINOSA  Final   Report Status 10/03/2018 FINAL  Final   Organism ID, Bacteria PSEUDOMONAS AERUGINOSA  Final      Susceptibility    Pseudomonas aeruginosa - MIC*    CEFTAZIDIME 16 INTERMEDIATE Intermediate     CIPROFLOXACIN <=0.25 SENSITIVE Sensitive     GENTAMICIN <=1 SENSITIVE Sensitive     IMIPENEM 2 SENSITIVE Sensitive     CEFEPIME INTERMEDIATE Intermediate     * MODERATE PSEUDOMONAS AERUGINOSA  Culture, Urine     Status: Abnormal   Collection Time: 10/01/18  5:00 PM  Result Value Ref Range Status  Specimen Description URINE, RANDOM  Final   Special Requests   Final    NONE Performed at [facility], [address], [place], [place] [address]    Culture (A)  Final    50,000 COLONIES/mL KLEBSIELLA PNEUMONIAE 50,000 COLONIES/mL PSEUDOMONAS AERUGINOSA    Report Status 10/03/2018 FINAL  Final   Organism ID, Bacteria KLEBSIELLA PNEUMONIAE (A)  Final   Organism ID, Bacteria PSEUDOMONAS AERUGINOSA (A)  Final      Susceptibility   Klebsiella pneumoniae - MIC*    AMPICILLIN >=32 RESISTANT Resistant     CEFAZOLIN <=4 SENSITIVE Sensitive     CEFTRIAXONE <=1 SENSITIVE Sensitive     CIPROFLOXACIN <=0.25 SENSITIVE Sensitive     GENTAMICIN <=1 SENSITIVE Sensitive     IMIPENEM <=0.25 SENSITIVE Sensitive     NITROFURANTOIN 64 INTERMEDIATE Intermediate     TRIMETH/SULFA <=20 SENSITIVE Sensitive     AMPICILLIN/SULBACTAM 4 SENSITIVE Sensitive     PIP/TAZO <=4 SENSITIVE Sensitive     Extended ESBL NEGATIVE Sensitive     * 50,000 COLONIES/mL KLEBSIELLA PNEUMONIAE   Pseudomonas aeruginosa - MIC*    CEFTAZIDIME 16 INTERMEDIATE Intermediate     CIPROFLOXACIN <=0.25 SENSITIVE Sensitive     GENTAMICIN <=1 SENSITIVE Sensitive     IMIPENEM 2 SENSITIVE Sensitive     CEFEPIME INTERMEDIATE Intermediate     * 50,000 COLONIES/mL PSEUDOMONAS AERUGINOSA    Coagulation Studies: No results for input(s): LABPROT, INR in the last 72 hours.  Urinalysis: No results for input(s): COLORURINE, LABSPEC, PHURINE, GLUCOSEU, HGBUR, BILIRUBINUR, KETONESUR, PROTEINUR, UROBILINOGEN, NITRITE, LEUKOCYTESUR in the last 72 hours.  Invalid  input(s): APPERANCEUR    Imaging: US Renal  Result Date: 10/09/2018 CLINICAL DATA:  68 year old male with acute kidney insufficiency. Subsequent encounter. EXAM: RENAL / URINARY TRACT ULTRASOUND COMPLETE COMPARISON:  09/15/2018 ultrasound. FINDINGS: Right Kidney: Renal measurements: 10.7 x 5.6 x 6.9 cm = volume: 216.8 mL . Echogenicity within normal limits. No mass or hydronephrosis visualized. Left Kidney: Renal measurements: 12.1 x 5.7 x 7.4 cm = volume: 264.2 mL. Echogenicity within normal limits. No mass or hydronephrosis visualized. Bladder: Decompressed with Foley catheter in place. Small amount of ascites noted after paracentesis. IMPRESSION: 1. No hydronephrosis. 2. Bladder decompressed by Foley catheter. Electronically Signed   By: Lacy Duverney M.D.   On: 10/09/2018 17:47   US Paracentesis  Result Date: 10/09/2018 INDICATION: Patient with abdominal distention, ascites. Request is made for therapeutic paracentesis. EXAM: ULTRASOUND GUIDED THERAPEUTIC PARACENTESIS MEDICATIONS: 10 mL 1% lidocaine COMPLICATIONS: None immediate. PROCEDURE: Informed written consent was obtained from the patient after a discussion of the risks, benefits and alternatives to treatment. A timeout was performed prior to the initiation of the procedure. Initial ultrasound scanning demonstrates a moderate amount of ascites within the right lower abdominal quadrant. The right lower abdomen was prepped and draped in the usual sterile fashion. 1% lidocaine was used for local anesthesia. Following this, a 19 gauge, 7-cm, Yueh catheter was introduced. An ultrasound image was saved for documentation purposes. The paracentesis was performed. The catheter was removed and a dressing was applied. The patient tolerated the procedure well without immediate post procedural complication. FINDINGS: A total of approximately 4.4 liters of yellow fluid was removed. IMPRESSION: Successful ultrasound-guided therapeutic paracentesis yielding 4.4  liters of peritoneal fluid. Read by: Loyce Dys PA-C Electronically Signed   By: Irish Lack M.D.   On: 10/09/2018 16:03      Assessment & Plan: Pt is a 68 y.o. 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  1.  Acute renal failure. 2.  Chronic kidney disease stage III baseline creatinine 1.4. 3.  Hyponatremia. 4.  Cirrhosis of the liver. 5.  Acute respiratory failure.  Plan: We are asked to see the patient for evaluation management of acute renal failure.  Baseline creatinine recently has been 1.48.  Suspect most likely that the patient is experiencing some level of intravascular volume depletion likely from hypoalbuminemia cirrhosis, and use of diuretics.  Agree with discontinuation of diuretics.  Continue albumin 25 g IV but increase to every 8 hours.  Hold off on IV fluid hydration.  Consider blood transfusion as hemoglobin currently 7.3.  No urgent indication for dialysis however this may need to be considered if family continues to desire aggressive measures.  Thanks for consultation.

## 2018-10-10 NOTE — Progress Notes (Addendum)
Pulmonary Critical Care Medicine [facility] [place]   PULMONARY CRITICAL CARE SERVICE  PROGRESS NOTE  Date of Service: 10/10/2018  DUGAN WACHTLER  BSW:[id number]  DOB: 1951/03/08   DOA: 09/13/2018  Referring Physician: Carron Curie, MD  HPI: HADRIEN Levy is a 68 y.o. male seen for follow up of Acute on Chronic Respiratory Failure.  Patient was placed on 2 L of oxygen this morning after being on BiPAP at night.  At some point during the day today he had increased work of breathing as well as a decreased O2 saturation and was placed back on BiPAP for total of 4 hours.  The patient rebounded well and is currently on 1 L of oxygen via nasal cannula.  He had a paracentesis yesterday with 4 L removed.  Medications: Reviewed on Rounds  Physical Exam:  Vitals: Pulse 75 respiration 22 BP 119/62 O2 sat 100% temp 97.6  Ventilator Settings patient's currently on 1 L of oxygen via nasal cannula no ventilator at this time.  . General: Comfortable at this time . Eyes: Grossly normal lids, irises & conjunctiva . ENT: grossly tongue is normal . Neck: no obvious mass . Cardiovascular: S1 S2 normal no gallop . Respiratory: No rales or rhonchi noted . Abdomen: soft . Skin: no rash seen on limited exam . Musculoskeletal: not rigid . Psychiatric:unable to assess . Neurologic: no seizure no involuntary movements         Lab Data:   Basic Metabolic Panel: Recent Labs  Lab 10/04/18 0522 10/06/18 0453 10/08/18 0601 10/09/18 0547 10/10/18 0546  NA 136 136 129* 127* 128*  K 3.7 3.8 3.5 4.0 3.9  CL 97* 95* 89* 89* 90*  CO2 32 34* 29 26 30   GLUCOSE 140* 143* 178* 117* 129*  BUN 49* 46* 51* 56* 57*  CREATININE 1.48* 1.65* 1.91* 2.07* 2.12*  CALCIUM 8.1* 7.9* 7.9* 7.8* 7.6*  MG 2.0 1.9 1.8  --   --   PHOS 3.7 3.5 3.4  --  3.8    ABG: No results for input(s): PHART, PCO2ART, PO2ART, HCO3, O2SAT in the last 168 hours.  Liver Function Tests: Recent Labs  Lab 10/06/18 0453  10/08/18 0601 10/10/18 0546  ALBUMIN 1.8* 1.7* 2.0*   No results for input(s): LIPASE, AMYLASE in the last 168 hours. Recent Labs  Lab 10/06/18 0453  AMMONIA 38*    CBC: Recent Labs  Lab 10/04/18 0522 10/06/18 0453 10/08/18 0601 10/10/18 0546  WBC 10.3 10.9* 15.3* 12.0*  HGB 8.1* 8.1* 7.9* 7.3*  HCT 25.8* 26.0* 24.5* 22.5*  MCV 87.2 87.8 87.2 87.9  PLT 178 175 173 149*    Cardiac Enzymes: No results for input(s): CKTOTAL, CKMB, CKMBINDEX, TROPONINI in the last 168 hours.  BNP (last 3 results) No results for input(s): BNP in the last 8760 hours.  ProBNP (last 3 results) No results for input(s): PROBNP in the last 8760 hours.  Radiological Exams: US Renal  Result Date: 10/09/2018 CLINICAL DATA:  68 year old male with acute kidney insufficiency. Subsequent encounter. EXAM: RENAL / URINARY TRACT ULTRASOUND COMPLETE COMPARISON:  09/15/2018 ultrasound. FINDINGS: Right Kidney: Renal measurements: 10.7 x 5.6 x 6.9 cm = volume: 216.8 mL . Echogenicity within normal limits. No mass or hydronephrosis visualized. Left Kidney: Renal measurements: 12.1 x 5.7 x 7.4 cm = volume: 264.2 mL. Echogenicity within normal limits. No mass or hydronephrosis visualized. Bladder: Decompressed with Foley catheter in place. Small amount of ascites noted after paracentesis. IMPRESSION: 1. No hydronephrosis. 2. Bladder decompressed  by Foley catheter. Electronically Signed   By: Lacy Duverney M.D.   On: 10/09/2018 17:47   US Paracentesis  Result Date: 10/09/2018 INDICATION: Patient with abdominal distention, ascites. Request is made for therapeutic paracentesis. EXAM: ULTRASOUND GUIDED THERAPEUTIC PARACENTESIS MEDICATIONS: 10 mL 1% lidocaine COMPLICATIONS: None immediate. PROCEDURE: Informed written consent was obtained from the patient after a discussion of the risks, benefits and alternatives to treatment. A timeout was performed prior to the initiation of the procedure. Initial ultrasound scanning  demonstrates a moderate amount of ascites within the right lower abdominal quadrant. The right lower abdomen was prepped and draped in the usual sterile fashion. 1% lidocaine was used for local anesthesia. Following this, a 19 gauge, 7-cm, Yueh catheter was introduced. An ultrasound image was saved for documentation purposes. The paracentesis was performed. The catheter was removed and a dressing was applied. The patient tolerated the procedure well without immediate post procedural complication. FINDINGS: A total of approximately 4.4 liters of yellow fluid was removed. IMPRESSION: Successful ultrasound-guided therapeutic paracentesis yielding 4.4 liters of peritoneal fluid. Read by: Loyce Dys PA-C Electronically Signed   By: Irish Lack M.D.   On: 10/09/2018 16:03    Assessment/Plan Active Problems:   Acute on chronic respiratory failure with hypoxia (HCC)   Acute on chronic diastolic heart failure (HCC)   Pulmonary hypertension (HCC)   Ascites of liver   MRSA bacteremia   1. Acute on chronic respiratory failure with hypoxia continue with 1 L of oxygen and titrate as tolerated.  Continue to manage patient secretions.  Continue aggressive pulmonary toilet and supportive measures. 2. Acute on chronic diastolic heart failure at baseline diuretics as tolerated there is some effusions noted bilaterally, concern for fluid overload. 3. Pulmonary hypertension on oxygen therapy 4. Ascites paracentesis performed yesterday with 4 L removed. 5. MRSA bacteremia treated.   I have personally seen and evaluated the patient, evaluated laboratory and imaging results, formulated the assessment and plan and placed orders. The Patient requires high complexity decision making for assessment and support.  Case was discussed on Rounds with the Respiratory Therapy Staff  Yevonne Pax, MD [facility] Pulmonary Critical Care Medicine Sleep Medicine

## 2018-10-11 ENCOUNTER — Other Ambulatory Visit (HOSPITAL_COMMUNITY): Payer: Medicare Other

## 2018-10-11 DIAGNOSIS — I5033 Acute on chronic diastolic (congestive) heart failure: Secondary | ICD-10-CM | POA: Diagnosis not present

## 2018-10-11 DIAGNOSIS — J9621 Acute and chronic respiratory failure with hypoxia: Secondary | ICD-10-CM | POA: Diagnosis not present

## 2018-10-11 DIAGNOSIS — R188 Other ascites: Secondary | ICD-10-CM | POA: Diagnosis not present

## 2018-10-11 DIAGNOSIS — R7881 Bacteremia: Secondary | ICD-10-CM | POA: Diagnosis not present

## 2018-10-11 LAB — RENAL FUNCTION PANEL
Albumin: 2.3 g/dL — ABNORMAL LOW (ref 3.5–5.0)
Anion gap: 8 (ref 5–15)
BUN: 62 mg/dL — ABNORMAL HIGH (ref 8–23)
CO2: 29 mmol/L (ref 22–32)
Calcium: 7.9 mg/dL — ABNORMAL LOW (ref 8.9–10.3)
Chloride: 90 mmol/L — ABNORMAL LOW (ref 98–111)
Creatinine, Ser: 2.27 mg/dL — ABNORMAL HIGH (ref 0.61–1.24)
GFR calc non Af Amer: 29 mL/min — ABNORMAL LOW (ref >60)
GFR, EST AFRICAN AMERICAN: 33 mL/min — AB (ref >60)
Glucose, Bld: 128 mg/dL — ABNORMAL HIGH (ref 70–99)
Phosphorus: 4.1 mg/dL (ref 2.5–4.6)
Potassium: 4.1 mmol/L (ref 3.5–5.1)
Sodium: 127 mmol/L — ABNORMAL LOW (ref 135–145)

## 2018-10-11 NOTE — Progress Notes (Addendum)
Pulmonary Critical Care Medicine [facility] [place]   PULMONARY CRITICAL CARE SERVICE  PROGRESS NOTE  Date of Service: 10/11/2018  Frank Levy  [id number]:[id number]  DOB: 02/04/1951   DOA: 09/13/2018  Referring Physician: Carron Curie, MD  HPI: Frank Levy is a 68 y.o. male seen for follow up of Acute on Chronic Respiratory Failure.  Patient continues to use 2 L of oxygen via nasal cannula during the day and placed on BiPAP at night.  Doing well overall at this time.  Medications: Reviewed on Rounds  Physical Exam:  Vitals: Pulse 70 respirations 23 BP 119/61 O2 sat 99% temp 97.4  Ventilator Settings when patient is on BiPAP he is on 16/8 with a rate of 30.  Marland Kitchen General: Comfortable at this time . Eyes: Grossly normal lids, irises & conjunctiva . ENT: grossly tongue is normal . Neck: no obvious mass . Cardiovascular: S1 S2 normal no gallop . Respiratory: No rales or rhonchi noted . Abdomen: soft . Skin: no rash seen on limited exam . Musculoskeletal: not rigid . Psychiatric:unable to assess . Neurologic: no seizure no involuntary movements         Lab Data:   Basic Metabolic Panel: Recent Labs  Lab 10/06/18 0453 10/08/18 0601 10/09/18 0547 10/10/18 0546 10/11/18 0539  NA 136 129* 127* 128* 127*  K 3.8 3.5 4.0 3.9 4.1  CL 95* 89* 89* 90* 90*  CO2 34* 29 26 30 29   GLUCOSE 143* 178* 117* 129* 128*  BUN 46* 51* 56* 57* 62*  CREATININE 1.65* 1.91* 2.07* 2.12* 2.27*  CALCIUM 7.9* 7.9* 7.8* 7.6* 7.9*  MG 1.9 1.8  --   --   --   PHOS 3.5 3.4  --  3.8 4.1    ABG: No results for input(s): PHART, PCO2ART, PO2ART, HCO3, O2SAT in the last 168 hours.  Liver Function Tests: Recent Labs  Lab 10/06/18 0453 10/08/18 0601 10/10/18 0546 10/11/18 0539  ALBUMIN 1.8* 1.7* 2.0* 2.3*   No results for input(s): LIPASE, AMYLASE in the last 168 hours. Recent Labs  Lab 10/06/18 0453  AMMONIA 38*    CBC: Recent Labs  Lab 10/06/18 0453 10/08/18 0601  10/10/18 0546  WBC 10.9* 15.3* 12.0*  HGB 8.1* 7.9* 7.3*  HCT 26.0* 24.5* 22.5*  MCV 87.8 87.2 87.9  PLT 175 173 149*    Cardiac Enzymes: No results for input(s): CKTOTAL, CKMB, CKMBINDEX, TROPONINI in the last 168 hours.  BNP (last 3 results) No results for input(s): BNP in the last 8760 hours.  ProBNP (last 3 results) No results for input(s): PROBNP in the last 8760 hours.  Radiological Exams: No results found.  Assessment/Plan Active Problems:   Acute on chronic respiratory failure with hypoxia (HCC)   Acute on chronic diastolic heart failure (HCC)   Pulmonary hypertension (HCC)   Ascites of liver   MRSA bacteremia   1. Acute on chronic respiratory failure with hypoxia continue with 2 L of oxygen via nasal cannula during the day and use BiPAP at night as needed.  Continue to manage patient's secretions with aggressive pulmonary toilet and supportive measures. 2. Acute on chronic diastolic heart failure at baseline diuretics as tolerated, continue surveillance for fluid overload. 3. Pulmonary hypertension on oxygen therapy 4. Ascites, paracentesis was performed 2 days ago and 4 L was removed. 5. MRSA bacteremia treated   I have personally seen and evaluated the patient, evaluated laboratory and imaging results, formulated the assessment and plan and placed orders. The Patient  requires high complexity decision making for assessment and support.  Case was discussed on Rounds with the Respiratory Therapy Staff  Frank Gee, MD [facility] Pulmonary Critical Care Medicine Sleep Medicine

## 2018-10-12 DIAGNOSIS — R188 Other ascites: Secondary | ICD-10-CM | POA: Diagnosis not present

## 2018-10-12 DIAGNOSIS — J9621 Acute and chronic respiratory failure with hypoxia: Secondary | ICD-10-CM | POA: Diagnosis not present

## 2018-10-12 DIAGNOSIS — I5033 Acute on chronic diastolic (congestive) heart failure: Secondary | ICD-10-CM | POA: Diagnosis not present

## 2018-10-12 DIAGNOSIS — R7881 Bacteremia: Secondary | ICD-10-CM | POA: Diagnosis not present

## 2018-10-12 LAB — RENAL FUNCTION PANEL
Albumin: 2.7 g/dL — ABNORMAL LOW (ref 3.5–5.0)
Anion gap: 9 (ref 5–15)
BUN: 63 mg/dL — AB (ref 8–23)
CO2: 28 mmol/L (ref 22–32)
CREATININE: 2.31 mg/dL — AB (ref 0.61–1.24)
Calcium: 8.2 mg/dL — ABNORMAL LOW (ref 8.9–10.3)
Chloride: 90 mmol/L — ABNORMAL LOW (ref 98–111)
GFR calc Af Amer: 32 mL/min — ABNORMAL LOW (ref >60)
GFR calc non Af Amer: 28 mL/min — ABNORMAL LOW (ref >60)
Glucose, Bld: 114 mg/dL — ABNORMAL HIGH (ref 70–99)
Phosphorus: 4.5 mg/dL (ref 2.5–4.6)
Potassium: 4.2 mmol/L (ref 3.5–5.1)
Sodium: 127 mmol/L — ABNORMAL LOW (ref 135–145)

## 2018-10-12 LAB — AMMONIA: Ammonia: 23 umol/L (ref 9–35)

## 2018-10-12 LAB — CBC
HCT: 21.3 % — ABNORMAL LOW (ref 39.0–52.0)
HEMOGLOBIN: 7 g/dL — AB (ref 13.0–17.0)
MCH: 28.5 pg (ref 26.0–34.0)
MCHC: 32.9 g/dL (ref 30.0–36.0)
MCV: 86.6 fL (ref 80.0–100.0)
Platelets: 121 10*3/uL — ABNORMAL LOW (ref 150–400)
RBC: 2.46 MIL/uL — ABNORMAL LOW (ref 4.22–5.81)
RDW: 15.9 % — ABNORMAL HIGH (ref 11.5–15.5)
WBC: 8.7 10*3/uL (ref 4.0–10.5)
nRBC: 0 % (ref 0.0–0.2)

## 2018-10-12 LAB — PREPARE RBC (CROSSMATCH)

## 2018-10-12 NOTE — Progress Notes (Signed)
Pulmonary Critical Care Medicine [facility] [place]   PULMONARY CRITICAL CARE SERVICE  PROGRESS NOTE  Date of Service: 10/12/2018  Frank Levy  VVK:[id number]  DOB: Nov 07, 1950   DOA: 09/13/2018  Referring Physician: Carron Curie, MD  HPI: Frank Levy is a 68 y.o. male seen for follow up of Acute on Chronic Respiratory Failure.  Patient is on 2 L using the BiPAP at nighttime seems to be tolerating it fairly well  Medications: Reviewed on Rounds  Physical Exam:  Vitals: Temperature 97.1 pulse 64 respiratory 26 blood pressure 126/72 saturations 97%  Ventilator Settings off the BiPAP this morning on 2 L  . General: Comfortable at this time . Eyes: Grossly normal lids, irises & conjunctiva . ENT: grossly tongue is normal . Neck: no obvious mass . Cardiovascular: S1 S2 normal no gallop . Respiratory: No rhonchi or rales are noted . Abdomen: soft . Skin: no rash seen on limited exam . Musculoskeletal: not rigid . Psychiatric:unable to assess . Neurologic: no seizure no involuntary movements         Lab Data:   Basic Metabolic Panel: Recent Labs  Lab 10/06/18 0453 10/08/18 0601 10/09/18 0547 10/10/18 0546 10/11/18 0539  NA 136 129* 127* 128* 127*  K 3.8 3.5 4.0 3.9 4.1  CL 95* 89* 89* 90* 90*  CO2 34* 29 26 30 29   GLUCOSE 143* 178* 117* 129* 128*  BUN 46* 51* 56* 57* 62*  CREATININE 1.65* 1.91* 2.07* 2.12* 2.27*  CALCIUM 7.9* 7.9* 7.8* 7.6* 7.9*  MG 1.9 1.8  --   --   --   PHOS 3.5 3.4  --  3.8 4.1    ABG: No results for input(s): PHART, PCO2ART, PO2ART, HCO3, O2SAT in the last 168 hours.  Liver Function Tests: Recent Labs  Lab 10/06/18 0453 10/08/18 0601 10/10/18 0546 10/11/18 0539  ALBUMIN 1.8* 1.7* 2.0* 2.3*   No results for input(s): LIPASE, AMYLASE in the last 168 hours. Recent Labs  Lab 10/06/18 0453  AMMONIA 38*    CBC: Recent Labs  Lab 10/06/18 0453 10/08/18 0601 10/10/18 0546  WBC 10.9* 15.3* 12.0*  HGB 8.1* 7.9*  7.3*  HCT 26.0* 24.5* 22.5*  MCV 87.8 87.2 87.9  PLT 175 173 149*    Cardiac Enzymes: No results for input(s): CKTOTAL, CKMB, CKMBINDEX, TROPONINI in the last 168 hours.  BNP (last 3 results) No results for input(s): BNP in the last 8760 hours.  ProBNP (last 3 results) No results for input(s): PROBNP in the last 8760 hours.  Radiological Exams: No results found.  Assessment/Plan Active Problems:   Acute on chronic respiratory failure with hypoxia (HCC)   Acute on chronic diastolic heart failure (HCC)   Pulmonary hypertension (HCC)   Ascites of liver   MRSA bacteremia   1. Acute on chronic respiratory failure with hypoxia continue with oxygen therapy and BiPAP at nighttime. 2. Acute on chronic diastolic heart failure diuretics as tolerated 3. Pulmonary hypertension on oxygen 4. Ascites continue to monitor fluid status 5. MRSA bacteremia treated resolved   I have personally seen and evaluated the patient, evaluated laboratory and imaging results, formulated the assessment and plan and placed orders. The Patient requires high complexity decision making for assessment and support.  Case was discussed on Rounds with the Respiratory Therapy Staff  Yevonne Pax, MD [facility] Pulmonary Critical Care Medicine Sleep Medicine

## 2018-10-12 NOTE — Progress Notes (Signed)
[facility]  ROUNDING NOTE   Subjective:  Patient remains critically ill at the moment. Urine output was 1000 mL over the preceding 24 hours. Renal function about the same however with a creatinine of 2.31. Hemoglobin down to 7 and patient receiving blood transfusion.   Objective:  Vital signs in last 24 hours:  Temperature 97.1 pulse 69 respirations 26 blood pressure 126/72  Physical Exam: General: Critically ill-appearing  Head:  BiPAP facemask on  Eyes: Anicteric  Neck: Supple, trachea midline  Lungs:   Bilateral rhonchi, BiPAP assisted  Heart: S1S2 no rubs  Abdomen:  Soft, nontender, bowel sounds present  Extremities: 2+ peripheral edema.  Neurologic: Awake, but not following commands  Skin: No lesions       Basic Metabolic Panel: Recent Labs  Lab 10/06/18 0453 10/08/18 0601 10/09/18 0547 10/10/18 0546 10/11/18 0539 10/12/18 0836  NA 136 129* 127* 128* 127* 127*  K 3.8 3.5 4.0 3.9 4.1 4.2  CL 95* 89* 89* 90* 90* 90*  CO2 34* 29 26 30 29 28   GLUCOSE 143* 178* 117* 129* 128* 114*  BUN 46* 51* 56* 57* 62* 63*  CREATININE 1.65* 1.91* 2.07* 2.12* 2.27* 2.31*  CALCIUM 7.9* 7.9* 7.8* 7.6* 7.9* 8.2*  MG 1.9 1.8  --   --   --   --   PHOS 3.5 3.4  --  3.8 4.1 4.5    Liver Function Tests: Recent Labs  Lab 10/06/18 0453 10/08/18 0601 10/10/18 0546 10/11/18 0539 10/12/18 0836  ALBUMIN 1.8* 1.7* 2.0* 2.3* 2.7*   No results for input(s): LIPASE, AMYLASE in the last 168 hours. Recent Labs  Lab 10/06/18 0453 10/12/18 0836  AMMONIA 38* 23    CBC: Recent Labs  Lab 10/06/18 0453 10/08/18 0601 10/10/18 0546 10/12/18 0836  WBC 10.9* 15.3* 12.0* 8.7  HGB 8.1* 7.9* 7.3* 7.0*  HCT 26.0* 24.5* 22.5* 21.3*  MCV 87.8 87.2 87.9 86.6  PLT 175 173 149* 121*    Cardiac Enzymes: No results for input(s): CKTOTAL, CKMB, CKMBINDEX, TROPONINI in the last 168 hours.  BNP: Invalid input(s): POCBNP  CBG: No results for input(s): GLUCAP in the last 168  hours.  Microbiology: Results for orders placed or performed during the hospital encounter of 09/13/18  Culture, blood (routine x 2)     Status: None   Collection Time: 09/25/18  3:08 PM  Result Value Ref Range Status   Specimen Description BLOOD RIGHT HAND  Final   Special Requests   Final    BOTTLES DRAWN AEROBIC ONLY Blood Culture results may not be optimal due to an inadequate volume of blood received in culture bottles   Culture   Final    NO GROWTH 5 DAYS Performed at [facility], [address] [place], [place] [address]    Report Status 09/30/2018 FINAL  Final  Culture, blood (routine x 2)     Status: None   Collection Time: 09/25/18  3:10 PM  Result Value Ref Range Status   Specimen Description BLOOD RIGHT ARM  Final   Special Requests   Final    BOTTLES DRAWN AEROBIC ONLY Blood Culture results may not be optimal due to an inadequate volume of blood received in culture bottles   Culture   Final    NO GROWTH 5 DAYS Performed at [facility], [address] [place], [place] [address]    Report Status 09/30/2018 FINAL  Final  Culture, respiratory     Status: None  Collection Time: 10/01/18  3:00 PM  Result Value Ref Range Status   Specimen Description TRACHEAL ASPIRATE  Final   Special Requests NONE  Final   Gram Stain   Final    MODERATE WBC PRESENT, PREDOMINANTLY PMN NO SQUAMOUS EPITHELIAL CELLS PRESENT FEW GRAM NEGATIVE RODS FEW GRAM POSITIVE RODS FEW GRAM POSITIVE COCCI IN PAIRS Performed at [facility], [address] [place], [place] [address]    Culture MODERATE PSEUDOMONAS AERUGINOSA  Final   Report Status 10/03/2018 FINAL  Final   Organism ID, Bacteria PSEUDOMONAS AERUGINOSA  Final      Susceptibility   Pseudomonas aeruginosa - MIC*    CEFTAZIDIME 16 INTERMEDIATE Intermediate     CIPROFLOXACIN <=0.25 SENSITIVE Sensitive     GENTAMICIN <=1 SENSITIVE Sensitive     IMIPENEM 2 SENSITIVE Sensitive     CEFEPIME INTERMEDIATE Intermediate      * MODERATE PSEUDOMONAS AERUGINOSA  Culture, Urine     Status: Abnormal   Collection Time: 10/01/18  5:00 PM  Result Value Ref Range Status   Specimen Description URINE, RANDOM  Final   Special Requests   Final    NONE Performed at [facility], [address], [place], [place] [address]    Culture (A)  Final    50,000 COLONIES/mL KLEBSIELLA PNEUMONIAE 50,000 COLONIES/mL PSEUDOMONAS AERUGINOSA    Report Status 10/03/2018 FINAL  Final   Organism ID, Bacteria KLEBSIELLA PNEUMONIAE (A)  Final   Organism ID, Bacteria PSEUDOMONAS AERUGINOSA (A)  Final      Susceptibility   Klebsiella pneumoniae - MIC*    AMPICILLIN >=32 RESISTANT Resistant     CEFAZOLIN <=4 SENSITIVE Sensitive     CEFTRIAXONE <=1 SENSITIVE Sensitive     CIPROFLOXACIN <=0.25 SENSITIVE Sensitive     GENTAMICIN <=1 SENSITIVE Sensitive     IMIPENEM <=0.25 SENSITIVE Sensitive     NITROFURANTOIN 64 INTERMEDIATE Intermediate     TRIMETH/SULFA <=20 SENSITIVE Sensitive     AMPICILLIN/SULBACTAM 4 SENSITIVE Sensitive     PIP/TAZO <=4 SENSITIVE Sensitive     Extended ESBL NEGATIVE Sensitive     * 50,000 COLONIES/mL KLEBSIELLA PNEUMONIAE   Pseudomonas aeruginosa - MIC*    CEFTAZIDIME 16 INTERMEDIATE Intermediate     CIPROFLOXACIN <=0.25 SENSITIVE Sensitive     GENTAMICIN <=1 SENSITIVE Sensitive     IMIPENEM 2 SENSITIVE Sensitive     CEFEPIME INTERMEDIATE Intermediate     * 50,000 COLONIES/mL PSEUDOMONAS AERUGINOSA    Coagulation Studies: No results for input(s): LABPROT, INR in the last 72 hours.  Urinalysis: No results for input(s): COLORURINE, LABSPEC, PHURINE, GLUCOSEU, HGBUR, BILIRUBINUR, KETONESUR, PROTEINUR, UROBILINOGEN, NITRITE, LEUKOCYTESUR in the last 72 hours.  Invalid input(s): APPERANCEUR    Imaging: No results found.   Medications:       Assessment/ Plan:  68 y.o. 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  1.  Acute renal failure. 2.  Chronic kidney disease stage III baseline creatinine 1.4. 3.  Hyponatremia. 4.  Cirrhosis of the liver. 5.  Acute respiratory failure.  Plan: The patient's renal function is about the same at the moment.  He remains total body volume overloaded.  However previously his renal function deteriorated on diuretics.  Hesitant to give IV fluids at the moment as  well.  Continue albumin support for now.  Urine output noted at 1 L over the preceding 24 hours.  Therefore no acute indication for dialysis.  We will continue to monitor the patient's progress next week.   LOS: 0 Frank Levy 2/28/20204:47 PM

## 2018-10-13 DIAGNOSIS — J9621 Acute and chronic respiratory failure with hypoxia: Secondary | ICD-10-CM | POA: Diagnosis not present

## 2018-10-13 DIAGNOSIS — R7881 Bacteremia: Secondary | ICD-10-CM | POA: Diagnosis not present

## 2018-10-13 DIAGNOSIS — I5033 Acute on chronic diastolic (congestive) heart failure: Secondary | ICD-10-CM | POA: Diagnosis not present

## 2018-10-13 DIAGNOSIS — R188 Other ascites: Secondary | ICD-10-CM | POA: Diagnosis not present

## 2018-10-13 LAB — RENAL FUNCTION PANEL
ALBUMIN: 3 g/dL — AB (ref 3.5–5.0)
Anion gap: 9 (ref 5–15)
BUN: 64 mg/dL — ABNORMAL HIGH (ref 8–23)
CO2: 30 mmol/L (ref 22–32)
Calcium: 8.2 mg/dL — ABNORMAL LOW (ref 8.9–10.3)
Chloride: 89 mmol/L — ABNORMAL LOW (ref 98–111)
Creatinine, Ser: 2.22 mg/dL — ABNORMAL HIGH (ref 0.61–1.24)
GFR calc Af Amer: 34 mL/min — ABNORMAL LOW (ref >60)
GFR, EST NON AFRICAN AMERICAN: 29 mL/min — AB (ref >60)
Glucose, Bld: 119 mg/dL — ABNORMAL HIGH (ref 70–99)
PHOSPHORUS: 4.9 mg/dL — AB (ref 2.5–4.6)
Potassium: 4.6 mmol/L (ref 3.5–5.1)
Sodium: 128 mmol/L — ABNORMAL LOW (ref 135–145)

## 2018-10-13 LAB — BPAM RBC
Blood Product Expiration Date: 202003262359
ISSUE DATE / TIME: 202002281407
Unit Type and Rh: 5100

## 2018-10-13 LAB — CBC
HEMATOCRIT: 25 % — AB (ref 39.0–52.0)
Hemoglobin: 8 g/dL — ABNORMAL LOW (ref 13.0–17.0)
MCH: 27.7 pg (ref 26.0–34.0)
MCHC: 32 g/dL (ref 30.0–36.0)
MCV: 86.5 fL (ref 80.0–100.0)
Platelets: 127 10*3/uL — ABNORMAL LOW (ref 150–400)
RBC: 2.89 MIL/uL — ABNORMAL LOW (ref 4.22–5.81)
RDW: 15.6 % — ABNORMAL HIGH (ref 11.5–15.5)
WBC: 9 10*3/uL (ref 4.0–10.5)
nRBC: 0 % (ref 0.0–0.2)

## 2018-10-13 LAB — TYPE AND SCREEN
ABO/RH(D): O POS
Antibody Screen: NEGATIVE
Unit division: 0

## 2018-10-13 NOTE — Progress Notes (Addendum)
Pulmonary Critical Care Medicine [facility] [place]   PULMONARY CRITICAL CARE SERVICE  PROGRESS NOTE  Date of Service: 10/13/2018  Frank Levy  ZWC:[id number]  DOB: 25-Jan-1951   DOA: 09/13/2018  Referring Physician: Carron Curie, MD  HPI: Frank Levy is a 68 y.o. male seen for follow up of Acute on Chronic Respiratory Failure.  Patient remains on 2 L of oxygen via nasal cannula during the day.  Using BiPAP at night.  He used his BiPAP for approximately 2 hours overnight last night however did wear it for 3 hours again during the day today so far.  Medications: Reviewed on Rounds  Physical Exam:  Vitals: Pulse 79 respirations 24 BP 134/79 O2 sat 97% temp 96.6  Ventilator Settings patient's not currently on ventilator  . General: Comfortable at this time . Eyes: Grossly normal lids, irises & conjunctiva . ENT: grossly tongue is normal . Neck: no obvious mass . Cardiovascular: S1 S2 normal no gallop . Respiratory: No rales or rhonchi noted . Abdomen: soft . Skin: no rash seen on limited exam . Musculoskeletal: not rigid . Psychiatric:unable to assess . Neurologic: no seizure no involuntary movements         Lab Data:   Basic Metabolic Panel: Recent Labs  Lab 10/08/18 0601 10/09/18 0547 10/10/18 0546 10/11/18 0539 10/12/18 0836 10/13/18 0705  NA 129* 127* 128* 127* 127* 128*  K 3.5 4.0 3.9 4.1 4.2 4.6  CL 89* 89* 90* 90* 90* 89*  CO2 29 26 30 29 28 30   GLUCOSE 178* 117* 129* 128* 114* 119*  BUN 51* 56* 57* 62* 63* 64*  CREATININE 1.91* 2.07* 2.12* 2.27* 2.31* 2.22*  CALCIUM 7.9* 7.8* 7.6* 7.9* 8.2* 8.2*  MG 1.8  --   --   --   --   --   PHOS 3.4  --  3.8 4.1 4.5 4.9*    ABG: No results for input(s): PHART, PCO2ART, PO2ART, HCO3, O2SAT in the last 168 hours.  Liver Function Tests: Recent Labs  Lab 10/08/18 0601 10/10/18 0546 10/11/18 0539 10/12/18 0836 10/13/18 0705  ALBUMIN 1.7* 2.0* 2.3* 2.7* 3.0*   No results for input(s):  LIPASE, AMYLASE in the last 168 hours. Recent Labs  Lab 10/12/18 0836  AMMONIA 23    CBC: Recent Labs  Lab 10/08/18 0601 10/10/18 0546 10/12/18 0836 10/13/18 0705  WBC 15.3* 12.0* 8.7 9.0  HGB 7.9* 7.3* 7.0* 8.0*  HCT 24.5* 22.5* 21.3* 25.0*  MCV 87.2 87.9 86.6 86.5  PLT 173 149* 121* 127*    Cardiac Enzymes: No results for input(s): CKTOTAL, CKMB, CKMBINDEX, TROPONINI in the last 168 hours.  BNP (last 3 results) No results for input(s): BNP in the last 8760 hours.  ProBNP (last 3 results) No results for input(s): PROBNP in the last 8760 hours.  Radiological Exams: No results found.  Assessment/Plan Active Problems:   Acute on chronic respiratory failure with hypoxia (HCC)   Acute on chronic diastolic heart failure (HCC)   Pulmonary hypertension (HCC)   Ascites of liver   MRSA bacteremia   1. Acute on chronic respiratory failure with hypoxia continue with oxygen therapy and BiPAP at nighttime. 2. Acute on chronic diastolic heart failure diuretics as tolerated. 3. Pulmonary hypertension on oxygen 4. Ascites continue to monitor fluid status 5. MRSA bacteremia treated resolved   I have personally seen and evaluated the patient, evaluated laboratory and imaging results, formulated the assessment and plan and placed orders. The Patient requires high complexity  decision making for assessment and support.  Case was discussed on Rounds with the Respiratory Therapy Staff  Allyne Gee, MD [facility] Pulmonary Critical Care Medicine Sleep Medicine

## 2018-10-14 DIAGNOSIS — R188 Other ascites: Secondary | ICD-10-CM | POA: Diagnosis not present

## 2018-10-14 DIAGNOSIS — J9621 Acute and chronic respiratory failure with hypoxia: Secondary | ICD-10-CM | POA: Diagnosis not present

## 2018-10-14 DIAGNOSIS — R7881 Bacteremia: Secondary | ICD-10-CM | POA: Diagnosis not present

## 2018-10-14 DIAGNOSIS — I5033 Acute on chronic diastolic (congestive) heart failure: Secondary | ICD-10-CM | POA: Diagnosis not present

## 2018-10-14 NOTE — Progress Notes (Addendum)
Pulmonary Critical Care Medicine [facility] [place]   PULMONARY CRITICAL CARE SERVICE  PROGRESS NOTE  Date of Service: 10/14/2018  Frank Levy  XTA:[id number]  DOB: 10/27/1950   DOA: 09/13/2018  Referring Physician: Carron Curie, MD  HPI: Frank Levy is a 68 y.o. male seen for follow up of Acute on Chronic Respiratory Failure.  Patient continues to use his BiPAP intermittently and appears to be needing it more often.  When he is not on BiPAP he uses 5 L of oxygen via nasal cannula.  It appears that fluid is beginning to recollect in his abdomen and paracentesis is probably in his future.  Medications: Reviewed on Rounds  Physical Exam:  Vitals: Pulse 74 respirations 33 BP 145/67 O2 sat 96% temp 97.3  Ventilator Settings not currently on ventilator  . General: Comfortable at this time . Eyes: Grossly normal lids, irises & conjunctiva . ENT: grossly tongue is normal . Neck: no obvious mass . Cardiovascular: S1 S2 normal no gallop . Respiratory: Coarse breath sounds . Abdomen: soft . Skin: no rash seen on limited exam . Musculoskeletal: not rigid . Psychiatric:unable to assess . Neurologic: no seizure no involuntary movements         Lab Data:   Basic Metabolic Panel: Recent Labs  Lab 10/08/18 0601 10/09/18 0547 10/10/18 0546 10/11/18 0539 10/12/18 0836 10/13/18 0705  NA 129* 127* 128* 127* 127* 128*  K 3.5 4.0 3.9 4.1 4.2 4.6  CL 89* 89* 90* 90* 90* 89*  CO2 29 26 30 29 28 30   GLUCOSE 178* 117* 129* 128* 114* 119*  BUN 51* 56* 57* 62* 63* 64*  CREATININE 1.91* 2.07* 2.12* 2.27* 2.31* 2.22*  CALCIUM 7.9* 7.8* 7.6* 7.9* 8.2* 8.2*  MG 1.8  --   --   --   --   --   PHOS 3.4  --  3.8 4.1 4.5 4.9*    ABG: No results for input(s): PHART, PCO2ART, PO2ART, HCO3, O2SAT in the last 168 hours.  Liver Function Tests: Recent Labs  Lab 10/08/18 0601 10/10/18 0546 10/11/18 0539 10/12/18 0836 10/13/18 0705  ALBUMIN 1.7* 2.0* 2.3* 2.7* 3.0*   No  results for input(s): LIPASE, AMYLASE in the last 168 hours. Recent Labs  Lab 10/12/18 0836  AMMONIA 23    CBC: Recent Labs  Lab 10/08/18 0601 10/10/18 0546 10/12/18 0836 10/13/18 0705  WBC 15.3* 12.0* 8.7 9.0  HGB 7.9* 7.3* 7.0* 8.0*  HCT 24.5* 22.5* 21.3* 25.0*  MCV 87.2 87.9 86.6 86.5  PLT 173 149* 121* 127*    Cardiac Enzymes: No results for input(s): CKTOTAL, CKMB, CKMBINDEX, TROPONINI in the last 168 hours.  BNP (last 3 results) No results for input(s): BNP in the last 8760 hours.  ProBNP (last 3 results) No results for input(s): PROBNP in the last 8760 hours.  Radiological Exams: No results found.  Assessment/Plan Active Problems:   Acute on chronic respiratory failure with hypoxia (HCC)   Acute on chronic diastolic heart failure (HCC)   Pulmonary hypertension (HCC)   Ascites of liver   MRSA bacteremia   1. Acute on chronic respiratory failure with hypoxia continue with oxygen therapy and BiPAP as needed. 2. Acute on chronic diastolic heart failure diuretics as tolerated 3. Pulmonary hypertension on oxygen 4. Ascites continue to monitor fluid status and evaluate for paracentesis. 5. MRSA bacteremia treated resolved   I have personally seen and evaluated the patient, evaluated laboratory and imaging results, formulated the assessment and plan and  placed orders. The Patient requires high complexity decision making for assessment and support.  Case was discussed on Rounds with the Respiratory Therapy Staff  Allyne Gee, MD [facility] Pulmonary Critical Care Medicine Sleep Medicine

## 2018-10-15 DIAGNOSIS — R188 Other ascites: Secondary | ICD-10-CM | POA: Diagnosis not present

## 2018-10-15 DIAGNOSIS — J9621 Acute and chronic respiratory failure with hypoxia: Secondary | ICD-10-CM | POA: Diagnosis not present

## 2018-10-15 DIAGNOSIS — I5033 Acute on chronic diastolic (congestive) heart failure: Secondary | ICD-10-CM | POA: Diagnosis not present

## 2018-10-15 DIAGNOSIS — R7881 Bacteremia: Secondary | ICD-10-CM | POA: Diagnosis not present

## 2018-10-15 LAB — MAGNESIUM: Magnesium: 2.4 mg/dL (ref 1.7–2.4)

## 2018-10-15 LAB — CBC
HCT: 23.8 % — ABNORMAL LOW (ref 39.0–52.0)
Hemoglobin: 7.9 g/dL — ABNORMAL LOW (ref 13.0–17.0)
MCH: 28 pg (ref 26.0–34.0)
MCHC: 33.2 g/dL (ref 30.0–36.0)
MCV: 84.4 fL (ref 80.0–100.0)
NRBC: 0 % (ref 0.0–0.2)
Platelets: 117 10*3/uL — ABNORMAL LOW (ref 150–400)
RBC: 2.82 MIL/uL — ABNORMAL LOW (ref 4.22–5.81)
RDW: 15.2 % (ref 11.5–15.5)
WBC: 9.5 10*3/uL (ref 4.0–10.5)

## 2018-10-15 LAB — RENAL FUNCTION PANEL
ALBUMIN: 3 g/dL — AB (ref 3.5–5.0)
Anion gap: 11 (ref 5–15)
BUN: 66 mg/dL — ABNORMAL HIGH (ref 8–23)
CO2: 27 mmol/L (ref 22–32)
Calcium: 8.3 mg/dL — ABNORMAL LOW (ref 8.9–10.3)
Chloride: 91 mmol/L — ABNORMAL LOW (ref 98–111)
Creatinine, Ser: 2.26 mg/dL — ABNORMAL HIGH (ref 0.61–1.24)
GFR calc Af Amer: 33 mL/min — ABNORMAL LOW (ref >60)
GFR calc non Af Amer: 29 mL/min — ABNORMAL LOW (ref >60)
GLUCOSE: 107 mg/dL — AB (ref 70–99)
Phosphorus: 4.6 mg/dL (ref 2.5–4.6)
Potassium: 4.4 mmol/L (ref 3.5–5.1)
Sodium: 129 mmol/L — ABNORMAL LOW (ref 135–145)

## 2018-10-15 LAB — AMMONIA: Ammonia: 32 umol/L (ref 9–35)

## 2018-10-15 NOTE — Progress Notes (Addendum)
Pulmonary Critical Care Medicine [facility] [place]   PULMONARY CRITICAL CARE SERVICE  PROGRESS NOTE  Date of Service: 10/15/2018  [name]  XMI:[id number]  DOB: [birth year]   DOA: 09/13/2018  Referring Physician: Carron Curie, MD  HPI: Frank Levy is a 68 y.o. male seen for follow up of Acute on Chronic Respiratory Failure.  Patient's oxygen saturation declined to 87% and he was placed back on BiPAP for 4 hours.  His wife asked that his mask be removed so that he may eat and he made it about 25 minutes before he desatted to 80% and was placed back on the BiPAP.  There is some discussion that a palliative care conversation will be coming in the next day or 2 with the patient and wife.  Medications: Reviewed on Rounds  Physical Exam:  Vitals: Pulse 100 respirations 38 BP 135/75 O2 sat 97% temp 96.9  Ventilator Settings patient is currently on BiPAP 16/8 40%.  . General: Comfortable at this time . Eyes: Grossly normal lids, irises & conjunctiva . ENT: grossly tongue is normal . Neck: no obvious mass . Cardiovascular: S1 S2 normal no gallop . Respiratory: Coarse breath sounds . Abdomen: soft . Skin: no rash seen on limited exam . Musculoskeletal: not rigid . Psychiatric:unable to assess . Neurologic: no seizure no involuntary movements         Lab Data:   Basic Metabolic Panel: Recent Labs  Lab 10/10/18 0546 10/11/18 0539 10/12/18 0836 10/13/18 0705 10/15/18 0529  NA 128* 127* 127* 128* 129*  K 3.9 4.1 4.2 4.6 4.4  CL 90* 90* 90* 89* 91*  CO2 30 29 28 30 27   GLUCOSE 129* 128* 114* 119* 107*  BUN 57* 62* 63* 64* 66*  CREATININE 2.12* 2.27* 2.31* 2.22* 2.26*  CALCIUM 7.6* 7.9* 8.2* 8.2* 8.3*  MG  --   --   --   --  2.4  PHOS 3.8 4.1 4.5 4.9* 4.6    ABG: No results for input(s): PHART, PCO2ART, PO2ART, HCO3, O2SAT in the last 168 hours.  Liver Function Tests: Recent Labs  Lab 10/10/18 0546 10/11/18 0539 10/12/18 0836 10/13/18 0705  10/15/18 0529  ALBUMIN 2.0* 2.3* 2.7* 3.0* 3.0*   No results for input(s): LIPASE, AMYLASE in the last 168 hours. Recent Labs  Lab 10/12/18 0836 10/15/18 0529  AMMONIA 23 32    CBC: Recent Labs  Lab 10/10/18 0546 10/12/18 0836 10/13/18 0705 10/15/18 0529  WBC 12.0* 8.7 9.0 9.5  HGB 7.3* 7.0* 8.0* 7.9*  HCT 22.5* 21.3* 25.0* 23.8*  MCV 87.9 86.6 86.5 84.4  PLT 149* 121* 127* 117*    Cardiac Enzymes: No results for input(s): CKTOTAL, CKMB, CKMBINDEX, TROPONINI in the last 168 hours.  BNP (last 3 results) No results for input(s): BNP in the last 8760 hours.  ProBNP (last 3 results) No results for input(s): PROBNP in the last 8760 hours.  Radiological Exams: No results found.  Assessment/Plan Active Problems:   Acute on chronic respiratory failure with hypoxia (HCC)   Acute on chronic diastolic heart failure (HCC)   Pulmonary hypertension (HCC)   Ascites of liver   MRSA bacteremia   1. Acute on chronic respiratory failure with hypoxia continue with oxygen therapy and BiPAP as needed. 2. Acute on chronic diastolic heart failure diuretics as tolerated 3. Pulmonary hypertension on oxygen 4. Ascites continue to monitor fluid status and evaluate for paracentesis 5. MRSA bacteremia treated resolved   I have personally seen and evaluated  the patient, evaluated laboratory and imaging results, formulated the assessment and plan and placed orders. The Patient requires high complexity decision making for assessment and support.  Case was discussed on Rounds with the Respiratory Therapy Staff  Allyne Gee, MD [facility] Pulmonary Critical Care Medicine Sleep Medicine

## 2018-10-16 DIAGNOSIS — R188 Other ascites: Secondary | ICD-10-CM | POA: Diagnosis not present

## 2018-10-16 DIAGNOSIS — R7881 Bacteremia: Secondary | ICD-10-CM | POA: Diagnosis not present

## 2018-10-16 DIAGNOSIS — I5033 Acute on chronic diastolic (congestive) heart failure: Secondary | ICD-10-CM | POA: Diagnosis not present

## 2018-10-16 DIAGNOSIS — J9621 Acute and chronic respiratory failure with hypoxia: Secondary | ICD-10-CM | POA: Diagnosis not present

## 2018-10-16 LAB — BASIC METABOLIC PANEL
Anion gap: 8 (ref 5–15)
BUN: 71 mg/dL — ABNORMAL HIGH (ref 8–23)
CALCIUM: 8.8 mg/dL — AB (ref 8.9–10.3)
CO2: 33 mmol/L — ABNORMAL HIGH (ref 22–32)
CREATININE: 2.1 mg/dL — AB (ref 0.61–1.24)
Chloride: 92 mmol/L — ABNORMAL LOW (ref 98–111)
GFR calc Af Amer: 36 mL/min — ABNORMAL LOW (ref >60)
GFR, EST NON AFRICAN AMERICAN: 31 mL/min — AB (ref >60)
Glucose, Bld: 128 mg/dL — ABNORMAL HIGH (ref 70–99)
Potassium: 4.3 mmol/L (ref 3.5–5.1)
Sodium: 133 mmol/L — ABNORMAL LOW (ref 135–145)

## 2018-10-16 LAB — PHOSPHORUS: Phosphorus: 4 mg/dL (ref 2.5–4.6)

## 2018-10-16 LAB — MAGNESIUM: Magnesium: 2.6 mg/dL — ABNORMAL HIGH (ref 1.7–2.4)

## 2018-10-16 NOTE — Progress Notes (Signed)
Pulmonary Critical Care Medicine [facility] [place]   PULMONARY CRITICAL CARE SERVICE  PROGRESS NOTE  Date of Service: 10/16/2018  Frank Levy  XNT:[id number]  DOB: April 26, 1951   DOA: 09/13/2018  Referring Physician: Carron Curie, MD  HPI: Frank Levy is a 68 y.o. male seen for follow up of Acute on Chronic Respiratory Failure.  Patient currently is on 4 L using the BiPAP at nighttime.  Patient is now going to be made comfort care with hospice  Medications: Reviewed on Rounds  Physical Exam:  Vitals: Temperature 98.9 pulse 82 respiratory 28 blood pressure 133/74 saturations 95%  Ventilator Settings off the ventilator on 4 L right now  . General: Comfortable at this time . Eyes: Grossly normal lids, irises & conjunctiva . ENT: grossly tongue is normal . Neck: no obvious mass . Cardiovascular: S1 S2 normal no gallop . Respiratory: Scattered rhonchi expansion is equal . Abdomen: soft . Skin: no rash seen on limited exam . Musculoskeletal: not rigid . Psychiatric:unable to assess . Neurologic: no seizure no involuntary movements         Lab Data:   Basic Metabolic Panel: Recent Labs  Lab 10/11/18 0539 10/12/18 0836 10/13/18 0705 10/15/18 0529 10/16/18 0505  NA 127* 127* 128* 129* 133*  K 4.1 4.2 4.6 4.4 4.3  CL 90* 90* 89* 91* 92*  CO2 29 28 30 27  33*  GLUCOSE 128* 114* 119* 107* 128*  BUN 62* 63* 64* 66* 71*  CREATININE 2.27* 2.31* 2.22* 2.26* 2.10*  CALCIUM 7.9* 8.2* 8.2* 8.3* 8.8*  MG  --   --   --  2.4 2.6*  PHOS 4.1 4.5 4.9* 4.6 4.0    ABG: No results for input(s): PHART, PCO2ART, PO2ART, HCO3, O2SAT in the last 168 hours.  Liver Function Tests: Recent Labs  Lab 10/10/18 0546 10/11/18 0539 10/12/18 0836 10/13/18 0705 10/15/18 0529  ALBUMIN 2.0* 2.3* 2.7* 3.0* 3.0*   No results for input(s): LIPASE, AMYLASE in the last 168 hours. Recent Labs  Lab 10/12/18 0836 10/15/18 0529  AMMONIA 23 32    CBC: Recent Labs  Lab  10/10/18 0546 10/12/18 0836 10/13/18 0705 10/15/18 0529  WBC 12.0* 8.7 9.0 9.5  HGB 7.3* 7.0* 8.0* 7.9*  HCT 22.5* 21.3* 25.0* 23.8*  MCV 87.9 86.6 86.5 84.4  PLT 149* 121* 127* 117*    Cardiac Enzymes: No results for input(s): CKTOTAL, CKMB, CKMBINDEX, TROPONINI in the last 168 hours.  BNP (last 3 results) No results for input(s): BNP in the last 8760 hours.  ProBNP (last 3 results) No results for input(s): PROBNP in the last 8760 hours.  Radiological Exams: No results found.  Assessment/Plan Active Problems:   Acute on chronic respiratory failure with hypoxia (HCC)   Acute on chronic diastolic heart failure (HCC)   Pulmonary hypertension (HCC)   Ascites of liver   MRSA bacteremia   1. Acute on chronic respiratory failure hypoxia we will continue with oxygen therapy and BiPAP at nighttime until the patient is made comfort care is supposed to be going with hospice possibly home 2. Acute on chronic diastolic heart failure we will continue supportive care monitor fluid status 3. Pulmonary hypertension on oxygen 4. Ascites advanced disease continue supportive care 5. MRSA bacteremia treated we will continue present management   I have personally seen and evaluated the patient, evaluated laboratory and imaging results, formulated the assessment and plan and placed orders. The Patient requires high complexity decision making for assessment and support.  Case was discussed on Rounds with the Respiratory Therapy Staff  Allyne Gee, MD [facility] Pulmonary Critical Care Medicine Sleep Medicine

## 2018-10-16 NOTE — Progress Notes (Signed)
[facility]  ROUNDING NOTE   Subjective:  Cr down to 2.10 at last check. Pt off bipap. Family moving towards home with hospice.    Objective:  Vital signs in last 24 hours:  Temperature 98.9 pulse 82 respirations 28 blood pressure 133/79  Physical Exam: General: Critically ill-appearing  Head: OM dry  Eyes: Anicteric  Neck: Supple, trachea midline  Lungs:  Bilateral rhonchi  Heart: S1S2 no rubs  Abdomen:  Soft, nontender, bowel sounds present  Extremities: 2+ peripheral edema.  Neurologic: Awake, but not following commands  Skin: No lesions       Basic Metabolic Panel: Recent Labs  Lab 10/11/18 0539 10/12/18 0836 10/13/18 0705 10/15/18 0529 10/16/18 0505  NA 127* 127* 128* 129* 133*  K 4.1 4.2 4.6 4.4 4.3  CL 90* 90* 89* 91* 92*  CO2 29 28 30 27  33*  GLUCOSE 128* 114* 119* 107* 128*  BUN 62* 63* 64* 66* 71*  CREATININE 2.27* 2.31* 2.22* 2.26* 2.10*  CALCIUM 7.9* 8.2* 8.2* 8.3* 8.8*  MG  --   --   --  2.4 2.6*  PHOS 4.1 4.5 4.9* 4.6 4.0    Liver Function Tests: Recent Labs  Lab 10/10/18 0546 10/11/18 0539 10/12/18 0836 10/13/18 0705 10/15/18 0529  ALBUMIN 2.0* 2.3* 2.7* 3.0* 3.0*   No results for input(s): LIPASE, AMYLASE in the last 168 hours. Recent Labs  Lab 10/12/18 0836 10/15/18 0529  AMMONIA 23 32    CBC: Recent Labs  Lab 10/10/18 0546 10/12/18 0836 10/13/18 0705 10/15/18 0529  WBC 12.0* 8.7 9.0 9.5  HGB 7.3* 7.0* 8.0* 7.9*  HCT 22.5* 21.3* 25.0* 23.8*  MCV 87.9 86.6 86.5 84.4  PLT 149* 121* 127* 117*    Cardiac Enzymes: No results for input(s): CKTOTAL, CKMB, CKMBINDEX, TROPONINI in the last 168 hours.  BNP: Invalid input(s): POCBNP  CBG: No results for input(s): GLUCAP in the last 168 hours.  Microbiology: Results for orders placed or performed during the hospital encounter of 09/13/18  Culture, blood (routine x 2)     Status: None   Collection Time: 09/25/18  3:08 PM  Result Value Ref Range Status   Specimen Description BLOOD RIGHT HAND  Final   Special Requests   Final    BOTTLES DRAWN AEROBIC ONLY Blood Culture results may not be optimal due to an inadequate volume of blood received in culture bottles   Culture   Final    NO GROWTH 5 DAYS Performed at [facility], [address], [place], [place] [address]    Report Status 09/30/2018 FINAL  Final  Culture, blood (routine x 2)     Status: None   Collection Time: 09/25/18  3:10 PM  Result Value Ref Range Status   Specimen Description BLOOD RIGHT ARM  Final   Special Requests   Final    BOTTLES DRAWN AEROBIC ONLY Blood Culture results may not be optimal due to an inadequate volume of blood received in culture bottles   Culture   Final    NO GROWTH 5 DAYS Performed at [facility], [address], [place], [place] [address]    Report Status 09/30/2018 FINAL  Final  Culture, respiratory     Status: None   Collection Time: 10/01/18  3:00 PM  Result Value Ref Range Status   Specimen Description TRACHEAL ASPIRATE  Final   Special Requests NONE  Final   Gram Stain   Final    MODERATE WBC PRESENT,  PREDOMINANTLY PMN NO SQUAMOUS EPITHELIAL CELLS PRESENT FEW GRAM NEGATIVE RODS FEW GRAM POSITIVE RODS FEW GRAM POSITIVE COCCI IN PAIRS Performed at [facility], [address], [place], [place] [address]    Culture MODERATE PSEUDOMONAS AERUGINOSA  Final   Report Status 10/03/2018 FINAL  Final   Organism ID, Bacteria PSEUDOMONAS AERUGINOSA  Final      Susceptibility   Pseudomonas aeruginosa - MIC*    CEFTAZIDIME 16 INTERMEDIATE Intermediate     CIPROFLOXACIN <=0.25 SENSITIVE Sensitive     GENTAMICIN <=1 SENSITIVE Sensitive     IMIPENEM 2 SENSITIVE Sensitive     CEFEPIME INTERMEDIATE Intermediate     * MODERATE PSEUDOMONAS AERUGINOSA  Culture, Urine     Status: Abnormal   Collection Time: 10/01/18  5:00 PM  Result Value Ref Range Status   Specimen Description URINE, RANDOM  Final   Special Requests   Final     NONE Performed at [facility], [address], [place], [place] [address]    Culture (A)  Final    50,000 COLONIES/mL KLEBSIELLA PNEUMONIAE 50,000 COLONIES/mL PSEUDOMONAS AERUGINOSA    Report Status 10/03/2018 FINAL  Final   Organism ID, Bacteria KLEBSIELLA PNEUMONIAE (A)  Final   Organism ID, Bacteria PSEUDOMONAS AERUGINOSA (A)  Final      Susceptibility   Klebsiella pneumoniae - MIC*    AMPICILLIN >=32 RESISTANT Resistant     CEFAZOLIN <=4 SENSITIVE Sensitive     CEFTRIAXONE <=1 SENSITIVE Sensitive     CIPROFLOXACIN <=0.25 SENSITIVE Sensitive     GENTAMICIN <=1 SENSITIVE Sensitive     IMIPENEM <=0.25 SENSITIVE Sensitive     NITROFURANTOIN 64 INTERMEDIATE Intermediate     TRIMETH/SULFA <=20 SENSITIVE Sensitive     AMPICILLIN/SULBACTAM 4 SENSITIVE Sensitive     PIP/TAZO <=4 SENSITIVE Sensitive     Extended ESBL NEGATIVE Sensitive     * 50,000 COLONIES/mL KLEBSIELLA PNEUMONIAE   Pseudomonas aeruginosa - MIC*    CEFTAZIDIME 16 INTERMEDIATE Intermediate     CIPROFLOXACIN <=0.25 SENSITIVE Sensitive     GENTAMICIN <=1 SENSITIVE Sensitive     IMIPENEM 2 SENSITIVE Sensitive     CEFEPIME INTERMEDIATE Intermediate     * 50,000 COLONIES/mL PSEUDOMONAS AERUGINOSA    Coagulation Studies: No results for input(s): LABPROT, INR in the last 72 hours.  Urinalysis: No results for input(s): COLORURINE, LABSPEC, PHURINE, GLUCOSEU, HGBUR, BILIRUBINUR, KETONESUR, PROTEINUR, UROBILINOGEN, NITRITE, LEUKOCYTESUR in the last 72 hours.  Invalid input(s): APPERANCEUR    Imaging: No results found.   Medications:       Assessment/ Plan:  68 y.o. 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  1.  Acute renal failure. 2.   Chronic kidney disease stage III baseline creatinine 1.4. 3.  Hyponatremia. 4.  Cirrhosis of the liver. 5.  Acute respiratory failure.  Plan: Renal function has improved a bit, most recent CR was down to 2.10.  However family has now decided upon home with hospice status which appears to be appropriate given clinical circumstance.  Would continue to offer food as pt is able to tolerate.  Otherwise no further renal input at this time.  Thanks for allowing [place] to participate.    LOS: 0 Lucila Klecka 3/3/20203:00 PM

## 2018-10-17 ENCOUNTER — Other Ambulatory Visit (HOSPITAL_COMMUNITY): Payer: Medicare Other

## 2018-10-17 LAB — CBC
HCT: 25.7 % — ABNORMAL LOW (ref 39.0–52.0)
HEMOGLOBIN: 8.1 g/dL — AB (ref 13.0–17.0)
MCH: 27.7 pg (ref 26.0–34.0)
MCHC: 31.5 g/dL (ref 30.0–36.0)
MCV: 88 fL (ref 80.0–100.0)
Platelets: 77 10*3/uL — ABNORMAL LOW (ref 150–400)
RBC: 2.92 MIL/uL — ABNORMAL LOW (ref 4.22–5.81)
RDW: 15.3 % (ref 11.5–15.5)
WBC: 10.1 10*3/uL (ref 4.0–10.5)
nRBC: 0 % (ref 0.0–0.2)

## 2018-10-17 LAB — RENAL FUNCTION PANEL
Albumin: 3.1 g/dL — ABNORMAL LOW (ref 3.5–5.0)
Anion gap: 8 (ref 5–15)
BUN: 70 mg/dL — ABNORMAL HIGH (ref 8–23)
CO2: 31 mmol/L (ref 22–32)
Calcium: 8.8 mg/dL — ABNORMAL LOW (ref 8.9–10.3)
Chloride: 94 mmol/L — ABNORMAL LOW (ref 98–111)
Creatinine, Ser: 1.97 mg/dL — ABNORMAL HIGH (ref 0.61–1.24)
GFR calc Af Amer: 39 mL/min — ABNORMAL LOW (ref >60)
GFR, EST NON AFRICAN AMERICAN: 34 mL/min — AB (ref >60)
Glucose, Bld: 96 mg/dL (ref 70–99)
PHOSPHORUS: 3.7 mg/dL (ref 2.5–4.6)
Potassium: 4.2 mmol/L (ref 3.5–5.1)
Sodium: 133 mmol/L — ABNORMAL LOW (ref 135–145)

## 2018-10-17 LAB — SEDIMENTATION RATE: Sed Rate: 24 mm/hr — ABNORMAL HIGH (ref 0–16)

## 2018-10-17 LAB — C-REACTIVE PROTEIN: CRP: 6.1 mg/dL — AB (ref <1.0)

## 2018-10-17 LAB — MAGNESIUM: MAGNESIUM: 2.6 mg/dL — AB (ref 1.7–2.4)

## 2018-10-17 LAB — AMMONIA: Ammonia: 24 umol/L (ref 9–35)

## 2018-11-14 DEATH — deceased

## 2019-10-27 IMAGING — US US THORACENTESIS ASP PLEURAL SPACE W/IMG GUIDE
1 series · 3 of 3 positions shown · non-contrast
Comparison: none

INDICATION: Patient with history of acute respiratory failure, pleural effusion.
Request is made for therapeutic left thoracentesis.

[Series 1: us thoracentesis asp pleural space w/img guide · 3 of 3 slices shown]
[im 1/3]
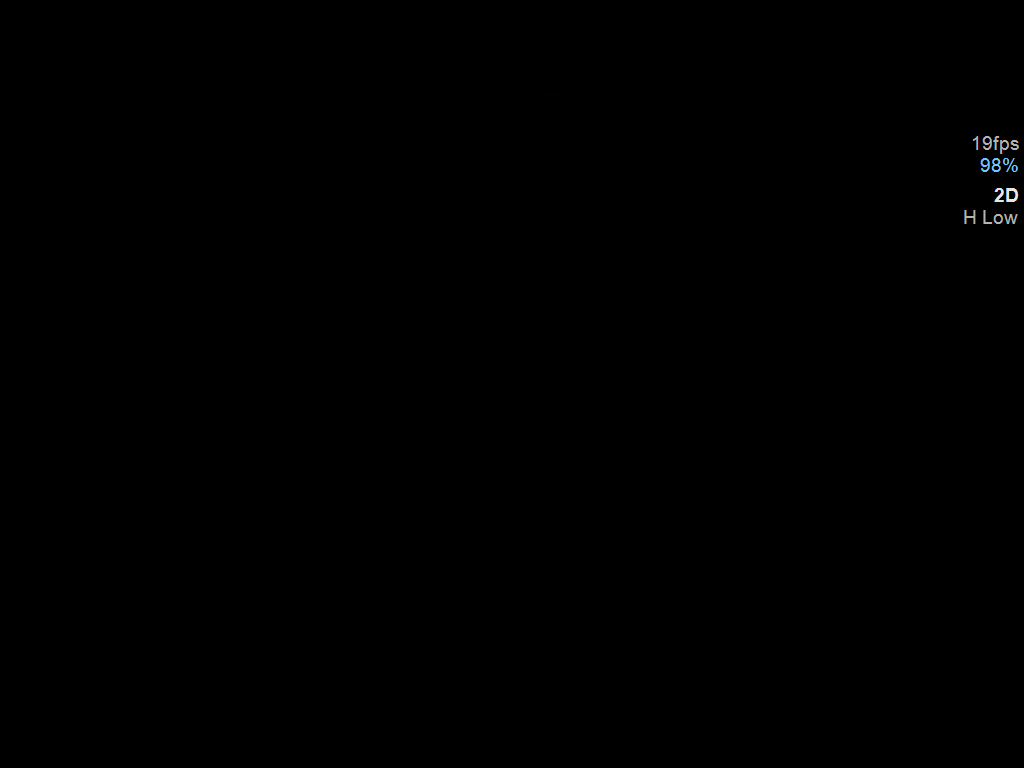
[im 2/3]
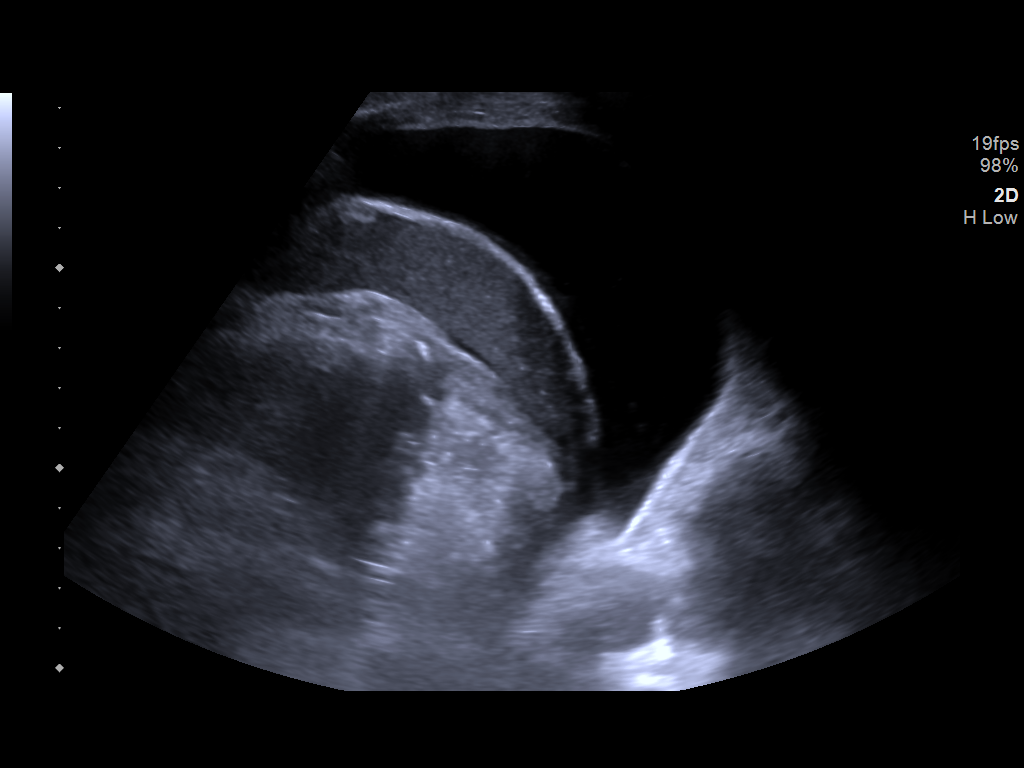
[im 3/3]
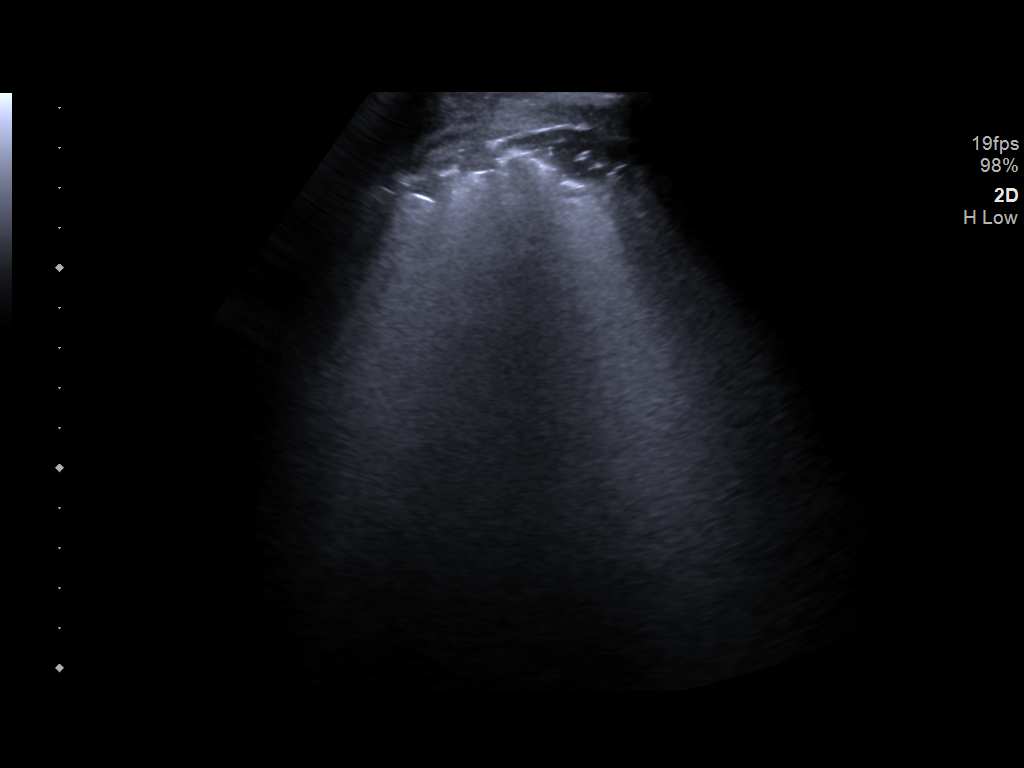

[3 of 3 positions shown; findings below may reference images not displayed]

EXAM:
ULTRASOUND GUIDED THERAPEUTIC LEFT THORACENTESIS

MEDICATIONS:
10 mL 1% lidocaine

COMPLICATIONS:
None immediate.

PROCEDURE:
An ultrasound guided thoracentesis was thoroughly discussed with the
patient and questions answered. The benefits, risks, alternatives
and complications were also discussed. The patient understands and
wishes to proceed with the procedure. Written consent was obtained.

Ultrasound was performed to localize and mark an adequate pocket of
fluid in the left chest. The area was then prepped and draped in the
normal sterile fashion. 1% Lidocaine was used for local anesthesia.
Under ultrasound guidance a Safe-T-Centesis catheter was introduced.
Thoracentesis was performed. The catheter was removed and a dressing
applied.
FINDINGS: A total of approximately 750 mL of yellow fluid was removed.
IMPRESSION: Successful ultrasound guided therapeutic left thoracentesis yielding
750 mL of pleural fluid.

## 2019-11-06 IMAGING — DX DG CHEST 1V PORT
1 series · 1 of 1 positions shown · non-contrast
Comparison: 09/17/2018

CLINICAL DATA: Pleural effusion.

EXAM:
PORTABLE CHEST 1 VIEW

[chest ap]
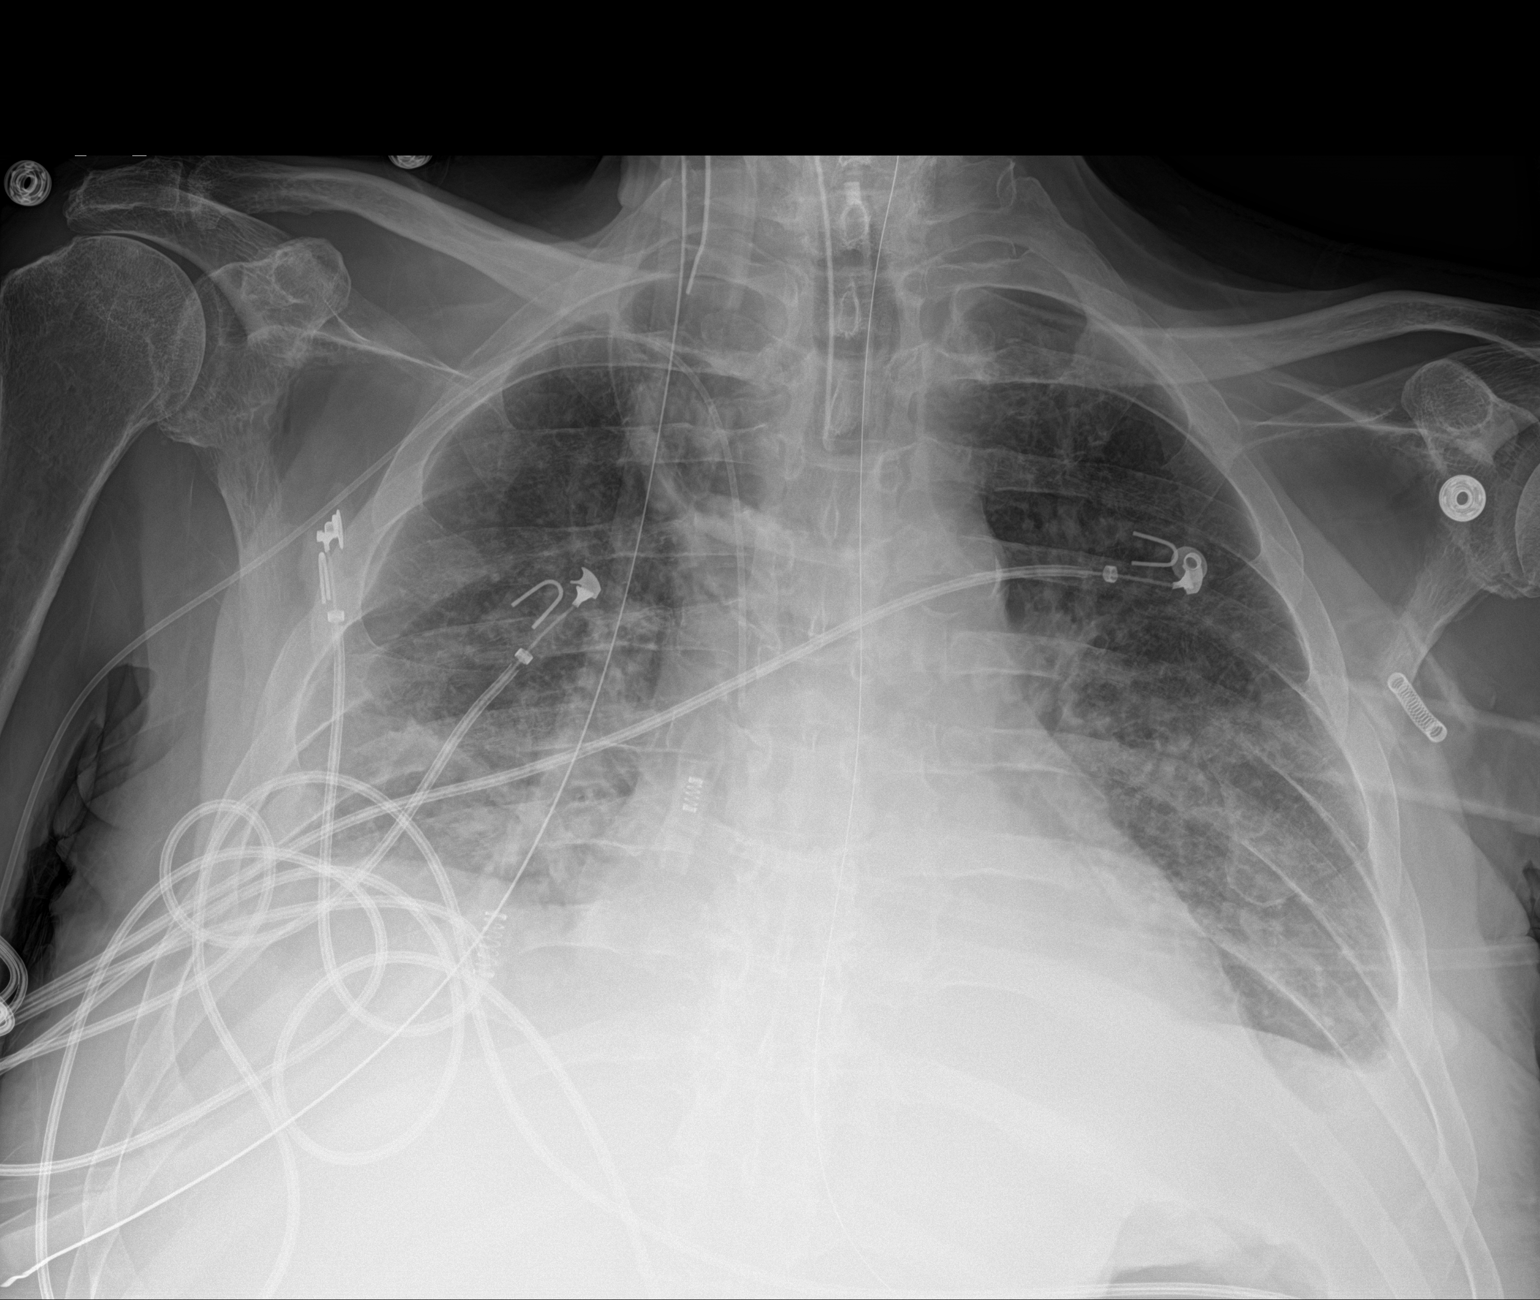

[1 of 1 positions shown; findings below may reference images not displayed]

FINDINGS: Support devices are stable. Cardiomegaly. Worsening bilateral
airspace disease, likely edema. Small layering effusions. No acute
bony abnormality.
IMPRESSION: Worsening edema/CHF.  Small bilateral effusions.

## 2019-11-11 IMAGING — DX DG CHEST 1V PORT
1 series · 1 of 1 positions shown · non-contrast
Comparison: 10/01/2018 and older studies.

CLINICAL DATA: Short of breath.  Follow-up exam.

EXAM:
PORTABLE CHEST 1 VIEW

[chest ap]
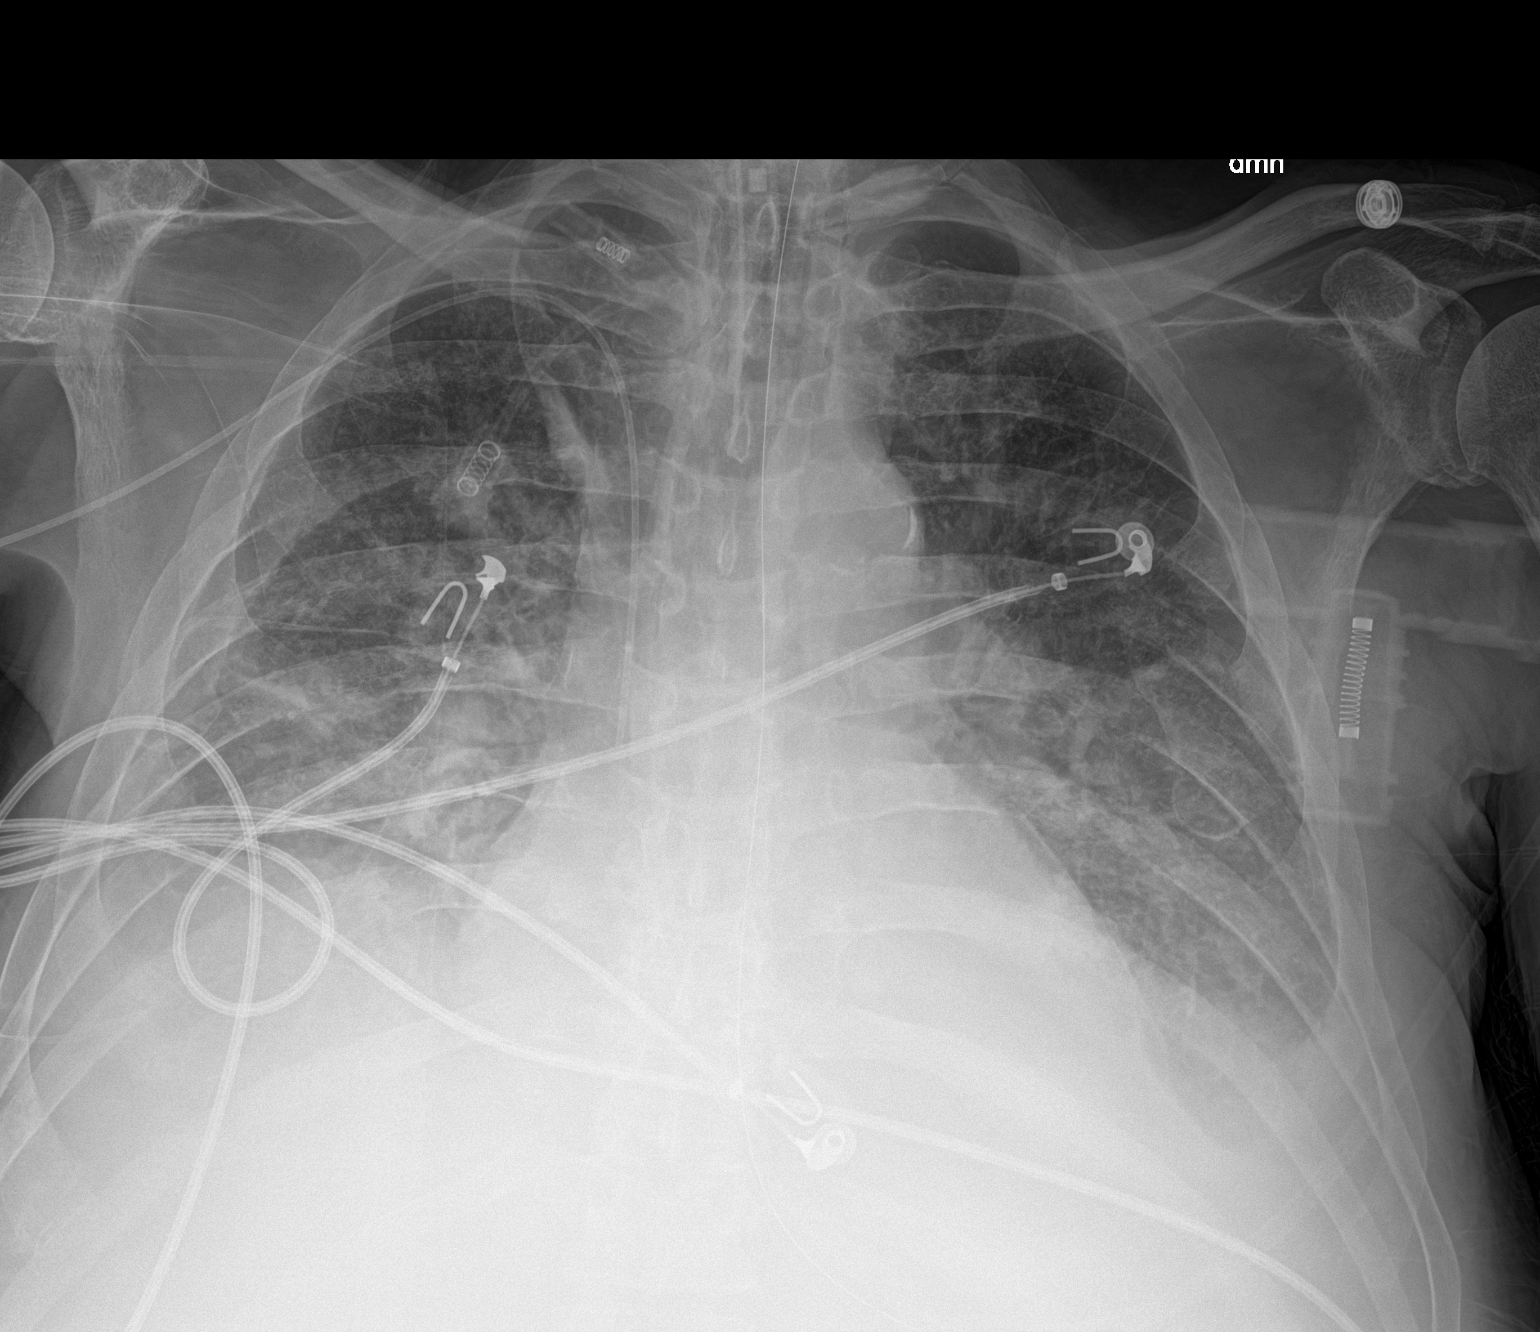

[1 of 1 positions shown; findings below may reference images not displayed]

FINDINGS: Opacity at both lung bases is consistent with bilateral effusions
with associated parenchymal opacity, likely atelectasis. Lungs show
prominent bronchovascular markings, without change.

No pneumothorax.

Endotracheal tube, nasal/orogastric tube and right PICC are stable.
IMPRESSION: 1. No significant change from the previous day's study. Persistent
bilateral pleural effusions with associated lung base parenchymal
opacity, most likely atelectasis.
2. Prominent bronchovascular markings/vascular congestion with
probable interstitial pulmonary edema.

## 2021-01-26 IMAGING — US US RENAL
1 series · 14 of 14 positions shown · non-contrast
Comparison: 09/15/2018 ultrasound.

CLINICAL DATA: 67-year-old male with acute kidney insufficiency.
Subsequent encounter.

EXAM:
RENAL / URINARY TRACT ULTRASOUND COMPLETE

[Series 1: us renal · 0.26mm/px · 14 of 14 slices shown]
[im 1/14]
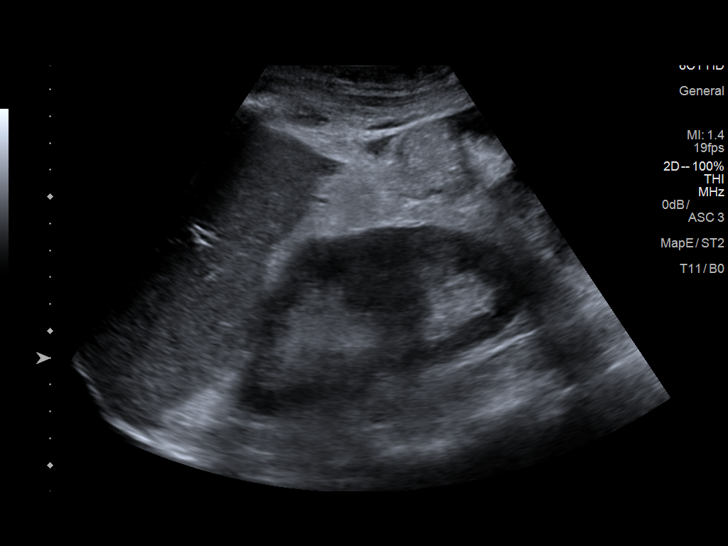
[im 2/14]
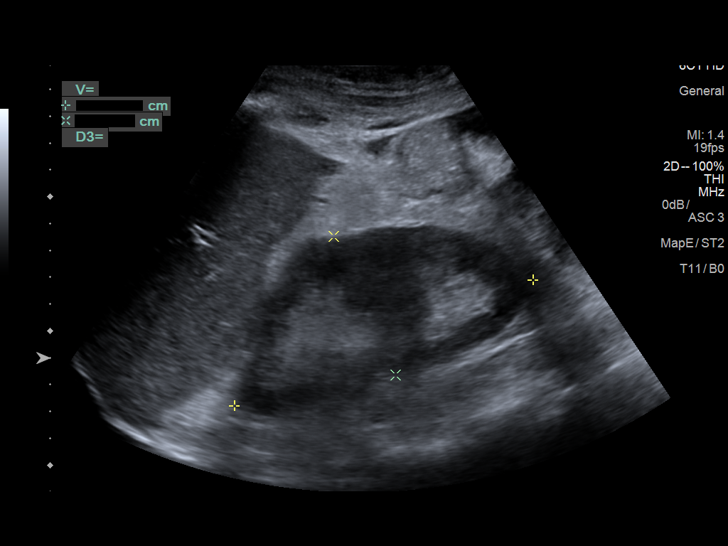
[im 3/14]
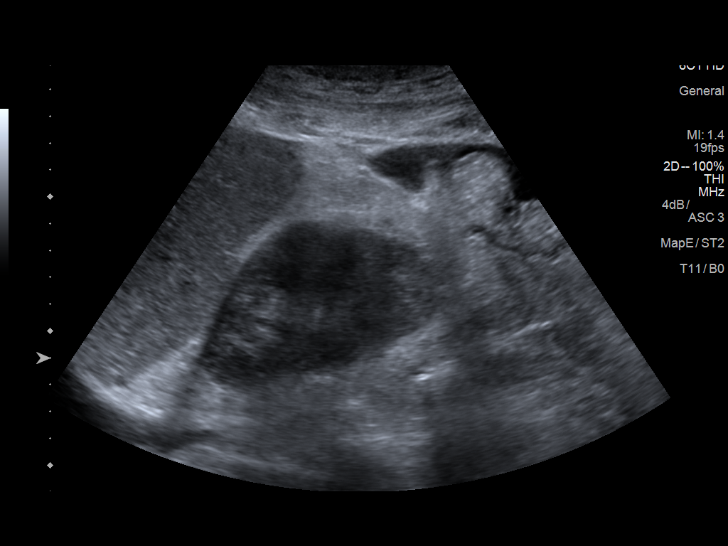
[im 4/14]
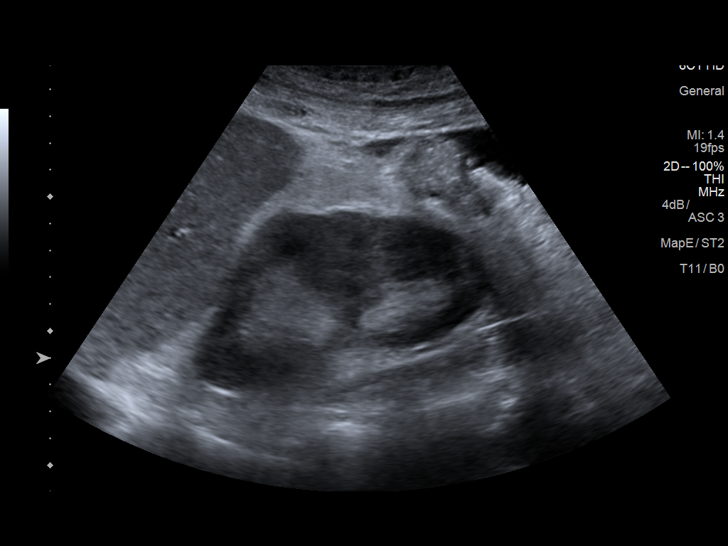
[im 5/14]
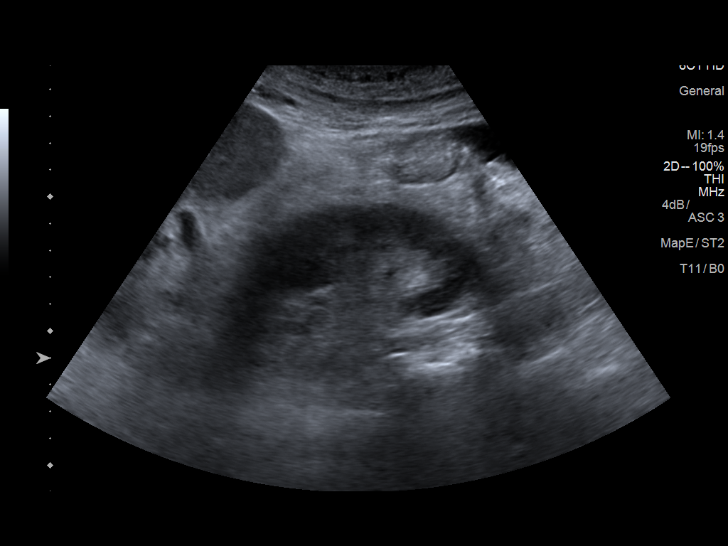
[im 6/14]
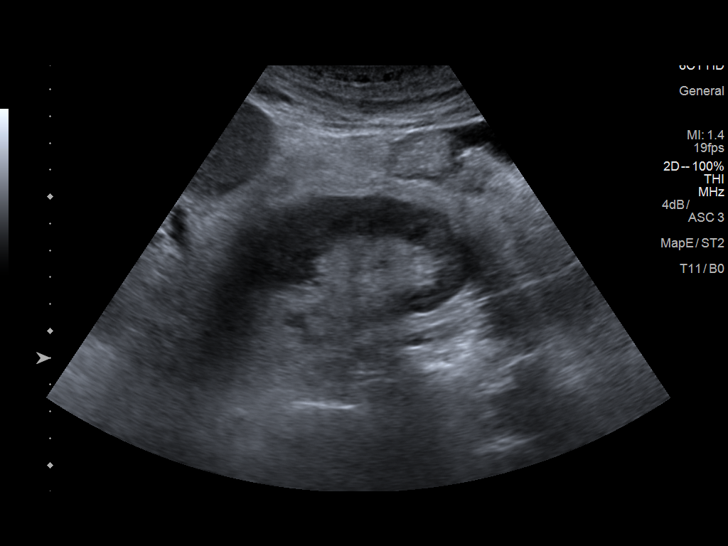
[im 7/14]
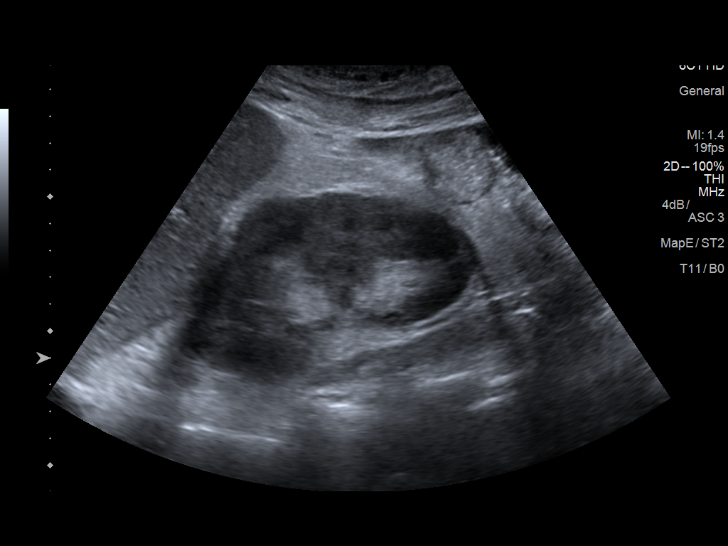
[im 8/14]
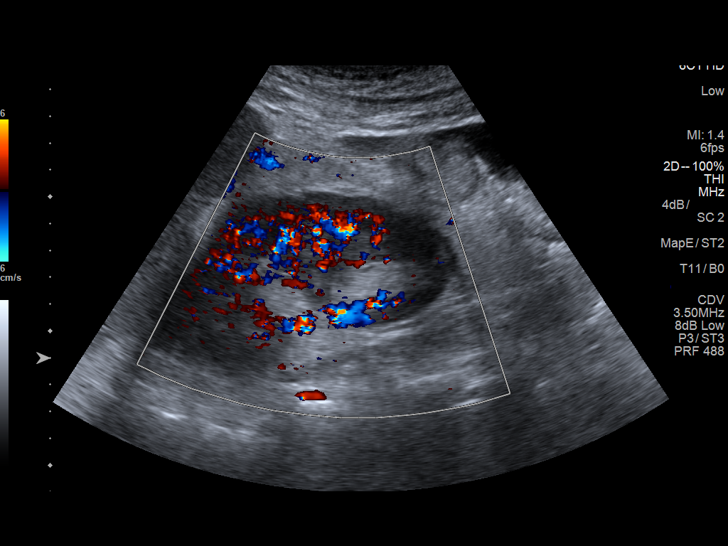
[im 9/14]
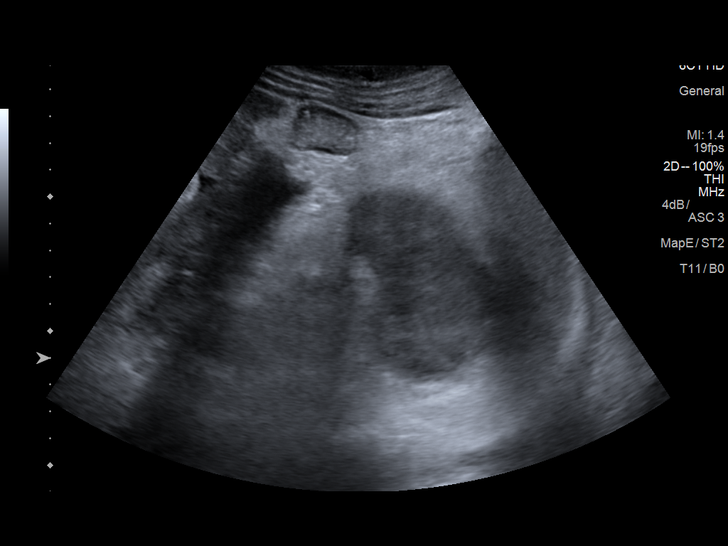
[im 10/14]
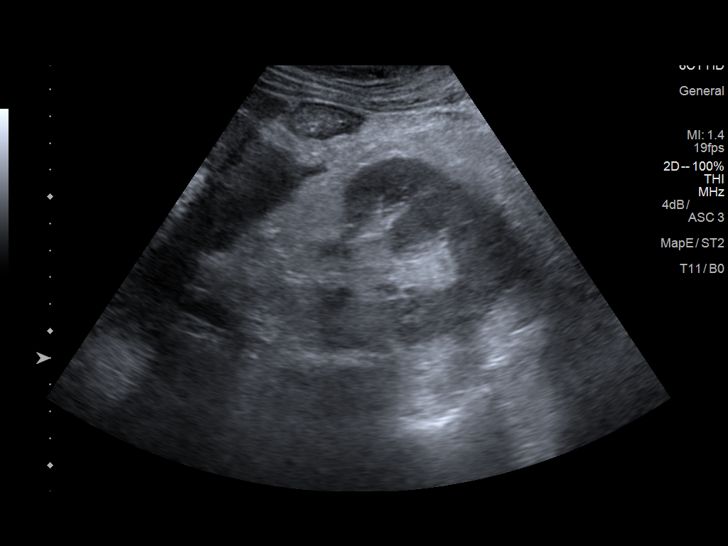
[im 11/14]
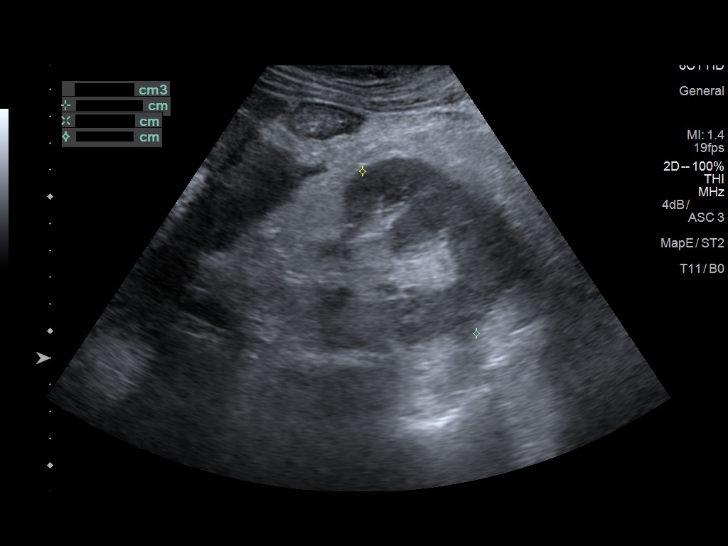
[im 12/14]
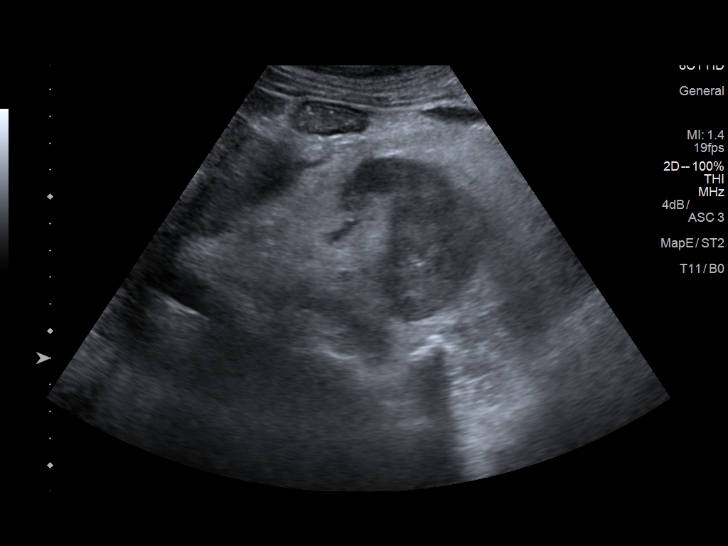
[im 13/14]
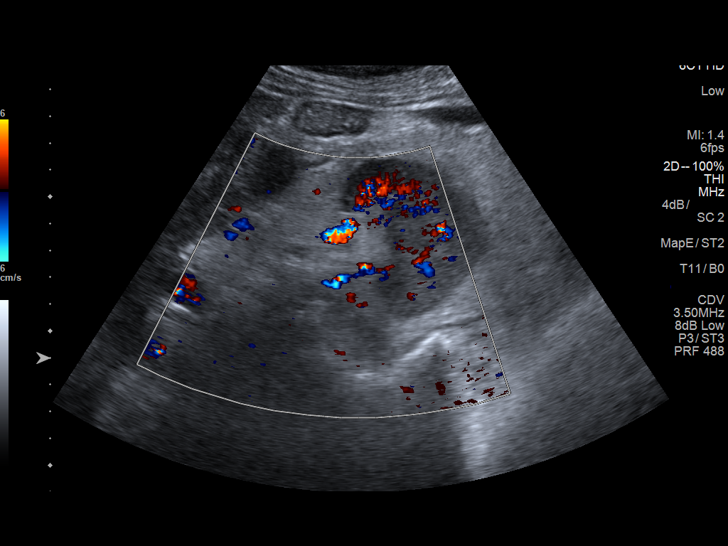
[im 14/14]
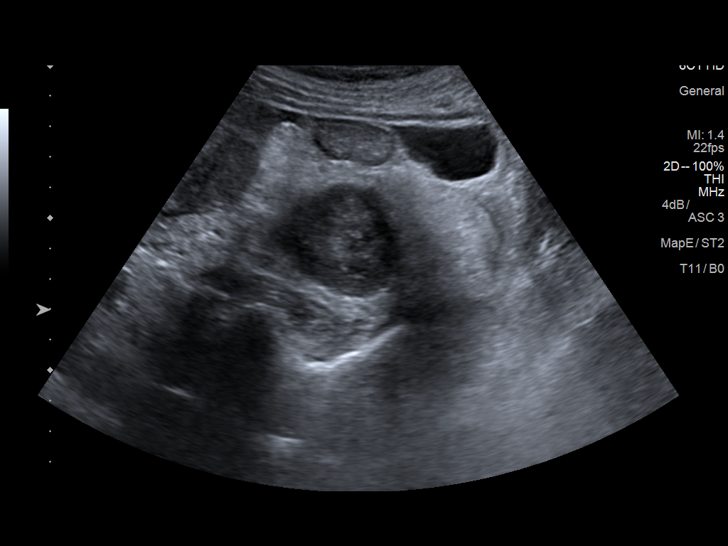

[14 of 14 positions shown; findings below may reference images not displayed]

FINDINGS: Right Kidney:

Renal measurements: 10.7 x 5.6 x 6.9 cm = volume: 216.8 mL .
Echogenicity within normal limits. No mass or hydronephrosis
visualized.

Left Kidney:

Renal measurements: 12.1 x 5.7 x 7.4 cm = volume: 264.2 mL.
Echogenicity within normal limits. No mass or hydronephrosis
visualized.

Bladder:

Decompressed with Foley catheter in place.

Small amount of ascites noted after paracentesis.
IMPRESSION: 1. No hydronephrosis.
2. Bladder decompressed by Foley catheter.
